# Patient Record
Sex: Male | Born: 1970 | Race: White | Hispanic: No | Marital: Married | Smoking: Never smoker
Health system: Western US, Academic
[De-identification: ages and names within clinical notes are randomized; demographics above are authoritative.]

## PROBLEM LIST (undated history)

## (undated) DIAGNOSIS — F1910 Other psychoactive substance abuse, uncomplicated: Secondary | ICD-10-CM

## (undated) DIAGNOSIS — B20 Human immunodeficiency virus [HIV] disease: Secondary | ICD-10-CM

## (undated) DIAGNOSIS — J45909 Unspecified asthma, uncomplicated: Secondary | ICD-10-CM

## (undated) DIAGNOSIS — Z21 Asymptomatic human immunodeficiency virus [HIV] infection status: Secondary | ICD-10-CM

## (undated) DIAGNOSIS — F1010 Alcohol abuse, uncomplicated: Secondary | ICD-10-CM

## (undated) HISTORY — PX: MANDIBLE SURGERY: SHX707

---

## 2010-11-13 ENCOUNTER — Ambulatory Visit (HOSPITAL_COMMUNITY)
Admission: RE | Admit: 2010-11-13 | Discharge: 2010-11-13 | Payer: Self-pay | Source: Home / Self Care | Admitting: Oral Surgery

## 2011-03-13 LAB — BASIC METABOLIC PANEL
BUN: 13 mg/dL (ref 6–23)
CO2: 28 mEq/L (ref 19–32)
Chloride: 105 mEq/L (ref 96–112)
Potassium: 4.1 mEq/L (ref 3.5–5.1)

## 2011-03-13 LAB — CBC
HCT: 38.5 % — ABNORMAL LOW (ref 39.0–52.0)
HCT: 39.6 % (ref 39.0–52.0)
Hemoglobin: 12.8 g/dL — ABNORMAL LOW (ref 13.0–17.0)
MCH: 28.1 pg (ref 26.0–34.0)
MCHC: 33.2 g/dL (ref 30.0–36.0)
MCV: 84.1 fL (ref 78.0–100.0)
RBC: 4.71 MIL/uL (ref 4.22–5.81)
RDW: 13.6 % (ref 11.5–15.5)
WBC: 4.1 10*3/uL (ref 4.0–10.5)

## 2011-03-13 LAB — COMPREHENSIVE METABOLIC PANEL
CO2: 27 mEq/L (ref 19–32)
Calcium: 9.1 mg/dL (ref 8.4–10.5)
Creatinine, Ser: 1.57 mg/dL — ABNORMAL HIGH (ref 0.4–1.5)
GFR calc non Af Amer: 49 mL/min — ABNORMAL LOW (ref >60)
Glucose, Bld: 84 mg/dL (ref 70–99)

## 2013-09-07 ENCOUNTER — Telehealth: Payer: Self-pay

## 2013-09-07 NOTE — Telephone Encounter (Signed)
Patient called regarding medical records received form [facility].  He states he has an appointment scheduled with [facility] on September 09, 2013 with physician.  I have advised him to keep this appointment and transfer to our facility for next appointment.    Patient stated he did not know he could transfer which is odd since I have his signed release.   Laurell Josephs, RN

## 2014-02-25 ENCOUNTER — Encounter (HOSPITAL_BASED_OUTPATIENT_CLINIC_OR_DEPARTMENT_OTHER): Payer: Self-pay | Admitting: Emergency Medicine

## 2014-02-25 ENCOUNTER — Emergency Department (HOSPITAL_BASED_OUTPATIENT_CLINIC_OR_DEPARTMENT_OTHER)
Admission: EM | Admit: 2014-02-25 | Discharge: 2014-02-26 | Disposition: A | Discharge status: Home / Self Care | Payer: Medicare Other | Attending: Emergency Medicine | Admitting: Emergency Medicine

## 2014-02-25 DIAGNOSIS — L309 Dermatitis, unspecified: Secondary | ICD-10-CM

## 2014-02-25 DIAGNOSIS — R519 Headache, unspecified: Secondary | ICD-10-CM

## 2014-02-25 DIAGNOSIS — F17200 Nicotine dependence, unspecified, uncomplicated: Secondary | ICD-10-CM | POA: Insufficient documentation

## 2014-02-25 DIAGNOSIS — L259 Unspecified contact dermatitis, unspecified cause: Secondary | ICD-10-CM | POA: Insufficient documentation

## 2014-02-25 DIAGNOSIS — R51 Headache: Secondary | ICD-10-CM | POA: Insufficient documentation

## 2014-02-25 MED ORDER — KETOROLAC TROMETHAMINE 60 MG/2ML IM SOLN
30.0000 mg | Freq: Once | INTRAMUSCULAR | Status: AC
  Administered 2014-02-26: 30 mg via INTRAMUSCULAR
  Filled 2014-02-25: qty 2

## 2014-02-25 NOTE — ED Provider Notes (Signed)
CSN: [id number]     Arrival date & time 02/25/14  2329 History   First MD Initiated Contact with Patient 02/25/14 2339     Chief Complaint  Patient presents with  . Headache     (Consider location/radiation/quality/duration/timing/severity/associated sxs/prior Treatment) HPI  This is a 43 yo old male with no significant past medical history who presents with headache and rash x2 months. Patient reports headaches on and off for the last 2 months. He has no history of migraines. He reports an occipital headache. Usually headache improved with Tylenol. He has not taken Tylenol today. Rates his pain today at 9/10. He denies any fevers, neck stiffness. He denies any weakness, numbness, tingling, or vision changes. He states that he came in today because "my daughter came up here to get checked out so I thought I would as well."  Patient also reports a rash over his lower extremities. He states that it is itchy.  He states that he is taking Benadryl with some relief. He notes a history of dry skin. He thinks it may be eczema. He denies any other symptoms including chest pain, shortness breath, abdominal pain, urinary symptoms.  History reviewed. No pertinent past medical history. Past Surgical History  Procedure Laterality Date  . Mandible surgery     History reviewed. No pertinent family history. History  Substance Use Topics  . Smoking status: Current Every Day Smoker  . Smokeless tobacco: Not on file  . Alcohol Use: Yes    Review of Systems  Constitutional: Negative.  Negative for fever.  Eyes: Negative for photophobia and visual disturbance.  Respiratory: Negative.  Negative for chest tightness and shortness of breath.   Cardiovascular: Negative.  Negative for chest pain.  Gastrointestinal: Negative.  Negative for abdominal pain.  Genitourinary: Negative.  Negative for dysuria.  Musculoskeletal: Negative for back pain.  Skin: Positive for rash.  Neurological: Positive for headaches.  Negative for dizziness, weakness and numbness.  All other systems reviewed and are negative.      Allergies  Review of patient's allergies indicates not on file.  Home Medications  No current outpatient prescriptions on file. BP 110/80  Pulse 77  Temp(Src) 98.6 F (37 C) (Oral)  Resp 16  Ht 6' (1.829 m)  Wt 160 lb (72.576 kg)  BMI 21.70 kg/m2  SpO2 96% Physical Exam  Nursing note and vitals reviewed. Constitutional: He is oriented to person, place, and time. No distress.  thin  HENT:  Head: Normocephalic and atraumatic.  Eyes: Conjunctivae and EOM are normal. Pupils are equal, round, and reactive to light.  Neck: Neck supple.  Cardiovascular: Normal rate, regular rhythm and normal heart sounds.   No murmur heard. Pulmonary/Chest: Effort normal and breath sounds normal. No respiratory distress. He has no wheezes.  Abdominal: Soft. Bowel sounds are normal. There is no tenderness. There is no rebound.  Musculoskeletal: He exhibits no edema.  Lymphadenopathy:    He has no cervical adenopathy.  Neurological: He is alert and oriented to person, place, and time. No cranial nerve deficit.  Coordination intact to finger-nose-finger, 5 out of 5 strength in all 470s, normal gait  Skin: Skin is warm and dry.  3 distinct patches over the bilateral shins approximately 3 cm in diameter, dry-appearing with excoriation and darkening of skin color, no obvious erythema, no vesicular lesions noted  Psychiatric: He has a normal mood and affect.    ED Course  Procedures (including critical care time) Labs Review Labs Reviewed - No data  to display Imaging Review No results found.  EKG Interpretation   None       MDM   Final diagnoses:  Headache  Eczema    Patient presents with headache and rash for 2 months. He is nontoxic-appearing and vital signs are within normal limits. He has a nonfocal neurologic exam. He has not taken anything for his headache today. At this time have  low suspicion for meningitis or subarachnoid given duration of symptoms. Patient does not have a primary care provider. Patient was given Toradol for his headache. Patient noted to have extremely dry skin. Rash consistent with eczema. Patient encouraged to use lotion for dry skin and hydrocortisone on patches.    Patient's headache improved with Toradol. Patient given primary care followup and resources.  After history, exam, and medical workup I feel the patient has been appropriately medically screened and is safe for discharge home. Pertinent diagnoses were discussed with the patient. Patient was given return precautions.     Shon Baton, MD 02/26/14 [phone]

## 2014-02-25 NOTE — ED Notes (Signed)
Headache and rash to lower legs x 2 months, pt here with daughter so he decided to get it checked out.

## 2014-02-26 MED ORDER — IBUPROFEN 400 MG PO TABS: 400.0000 mg | ORAL_TABLET | Freq: Four times a day (QID) | ORAL | Status: DC | PRN

## 2014-02-26 MED ORDER — HYDROCORTISONE 1 % EX CREA: TOPICAL_CREAM | CUTANEOUS | Status: DC

## 2014-02-26 NOTE — Discharge Instructions (Signed)
Eczema Eczema, also called atopic dermatitis, is a skin disorder that causes inflammation of the skin. It causes a red rash and dry, scaly skin. The skin becomes very itchy. Eczema is generally worse during the cooler winter months and often improves with the warmth of summer. Eczema usually starts showing signs in infancy. Some children outgrow eczema, but it may last through adulthood.  CAUSES  The exact cause of eczema is not known, but it appears to run in families. People with eczema often have a family history of eczema, allergies, asthma, or hay fever. Eczema is not contagious. Flare-ups of the condition may be caused by:   Contact with something you are sensitive or allergic to.   Stress. SIGNS AND SYMPTOMS  Dry, scaly skin.   Red, itchy rash.   Itchiness. This may occur before the skin rash and may be very intense.  DIAGNOSIS  The diagnosis of eczema is usually made based on symptoms and medical history. TREATMENT  Eczema cannot be cured, but symptoms usually can be controlled with treatment and other strategies. A treatment plan might include:  Controlling the itching and scratching.   Use over-the-counter antihistamines as directed for itching. This is especially useful at night when the itching tends to be worse.   Use over-the-counter steroid creams as directed for itching.   Avoid scratching. Scratching makes the rash and itching worse. It may also result in a skin infection (impetigo) due to a break in the skin caused by scratching.   Keeping the skin well moisturized with creams every day. This will seal in moisture and help prevent dryness. Lotions that contain alcohol and water should be avoided because they can dry the skin.   Limiting exposure to things that you are sensitive or allergic to (allergens).   Recognizing situations that cause stress.   Developing a plan to manage stress.  HOME CARE INSTRUCTIONS   Only take over-the-counter or  prescription medicines as directed by your health care provider.   Do not use anything on the skin without checking with your health care provider.   Keep baths or showers short (5 minutes) in warm (not hot) water. Use mild cleansers for bathing. These should be unscented. You may add nonperfumed bath oil to the bath water. It is best to avoid soap and bubble bath.   Immediately after a bath or shower, when the skin is still damp, apply a moisturizing ointment to the entire body. This ointment should be a petroleum ointment. This will seal in moisture and help prevent dryness. The thicker the ointment, the better. These should be unscented.   Keep fingernails cut short. Children with eczema may need to wear soft gloves or mittens at night after applying an ointment.   Dress in clothes made of cotton or cotton blends. Dress lightly, because heat increases itching.   A child with eczema should stay away from anyone with fever blisters or cold sores. The virus that causes fever blisters (herpes simplex) can cause a serious skin infection in children with eczema. SEEK MEDICAL CARE IF:   Your itching interferes with sleep.   Your rash gets worse or is not better within 1 week after starting treatment.   You see pus or soft yellow scabs in the rash area.   You have a fever.   You have a rash flare-up after contact with someone who has fever blisters.  Document Released: 12/14/2000 Document Revised: 10/07/2013 Document Reviewed: 07/20/2013 [facility] Patient Information 2014 [place]. Migraine Headache  A migraine headache is an intense, throbbing pain on one or both sides of your head. A migraine can last for 30 minutes to several hours. CAUSES  The exact cause of a migraine headache is not always known. However, a migraine may be caused when nerves in the brain become irritated and release chemicals that cause inflammation. This causes pain. Certain things may also trigger  migraines, such as:  Alcohol.  Smoking.  Stress.  Menstruation.  Aged cheeses.  Foods or drinks that contain nitrates, glutamate, aspartame, or tyramine.  Lack of sleep.  Chocolate.  Caffeine.  Hunger.  Physical exertion.  Fatigue.  Medicines used to treat chest pain (nitroglycerine), birth control pills, estrogen, and some blood pressure medicines. SIGNS AND SYMPTOMS  Pain on one or both sides of your head.  Pulsating or throbbing pain.  Severe pain that prevents daily activities.  Pain that is aggravated by any physical activity.  Nausea, vomiting, or both.  Dizziness.  Pain with exposure to bright lights, loud noises, or activity.  General sensitivity to bright lights, loud noises, or smells. Before you get a migraine, you may get warning signs that a migraine is coming (aura). An aura may include:  Seeing flashing lights.  Seeing bright spots, halos, or zig-zag lines.  Having tunnel vision or blurred vision.  Having feelings of numbness or tingling.  Having trouble talking.  Having muscle weakness. DIAGNOSIS  A migraine headache is often diagnosed based on:  Symptoms.  Physical exam.  A CT scan or MRI of your head. These imaging tests cannot diagnose migraines, but they can help rule out other causes of headaches. TREATMENT Medicines may be given for pain and nausea. Medicines can also be given to help prevent recurrent migraines.  HOME CARE INSTRUCTIONS  Only take over-the-counter or prescription medicines for pain or discomfort as directed by your health care provider. The use of long-term narcotics is not recommended.  Lie down in a dark, quiet room when you have a migraine.  Keep a journal to find out what may trigger your migraine headaches. For example, write down:  What you eat and drink.  How much sleep you get.  Any change to your diet or medicines.  Limit alcohol consumption.  Quit smoking if you smoke.  Get 7 9 hours  of sleep, or as recommended by your health care provider.  Limit stress.  Keep lights dim if bright lights bother you and make your migraines worse. SEEK IMMEDIATE MEDICAL CARE IF:   Your migraine becomes severe.  You have a fever.  You have a stiff neck.  You have vision loss.  You have muscular weakness or loss of muscle control.  You start losing your balance or have trouble walking.  You feel faint or pass out.  You have severe symptoms that are different from your first symptoms. MAKE SURE YOU:   Understand these instructions.  Will watch your condition.  Will get help right away if you are not doing well or get worse. Document Released: 12/17/2005 Document Revised: 10/07/2013 Document Reviewed: 08/24/2013 [facility] Patient Information 2014 [place], [place].

## 2014-12-30 ENCOUNTER — Emergency Department (HOSPITAL_BASED_OUTPATIENT_CLINIC_OR_DEPARTMENT_OTHER)
Admission: EM | Admit: 2014-12-30 | Discharge: 2014-12-30 | Disposition: A | Discharge status: Home / Self Care | Payer: Medicare Other | Attending: Emergency Medicine | Admitting: Emergency Medicine

## 2014-12-30 ENCOUNTER — Emergency Department (HOSPITAL_BASED_OUTPATIENT_CLINIC_OR_DEPARTMENT_OTHER): Payer: Medicare Other

## 2014-12-30 ENCOUNTER — Encounter (HOSPITAL_BASED_OUTPATIENT_CLINIC_OR_DEPARTMENT_OTHER): Payer: Self-pay | Admitting: *Deleted

## 2014-12-30 DIAGNOSIS — Z21 Asymptomatic human immunodeficiency virus [HIV] infection status: Secondary | ICD-10-CM | POA: Insufficient documentation

## 2014-12-30 DIAGNOSIS — Z7952 Long term (current) use of systemic steroids: Secondary | ICD-10-CM | POA: Diagnosis not present

## 2014-12-30 DIAGNOSIS — Z79899 Other long term (current) drug therapy: Secondary | ICD-10-CM | POA: Insufficient documentation

## 2014-12-30 DIAGNOSIS — J40 Bronchitis, not specified as acute or chronic: Secondary | ICD-10-CM | POA: Insufficient documentation

## 2014-12-30 DIAGNOSIS — R059 Cough, unspecified: Secondary | ICD-10-CM

## 2014-12-30 DIAGNOSIS — Z720 Tobacco use: Secondary | ICD-10-CM | POA: Insufficient documentation

## 2014-12-30 DIAGNOSIS — R0602 Shortness of breath: Secondary | ICD-10-CM | POA: Diagnosis present

## 2014-12-30 DIAGNOSIS — R0981 Nasal congestion: Secondary | ICD-10-CM

## 2014-12-30 DIAGNOSIS — R05 Cough: Secondary | ICD-10-CM

## 2014-12-30 HISTORY — DX: Other psychoactive substance abuse, uncomplicated: F19.10

## 2014-12-30 HISTORY — DX: Human immunodeficiency virus (HIV) disease: B20

## 2014-12-30 HISTORY — DX: Unspecified asthma, uncomplicated: J45.909

## 2014-12-30 HISTORY — DX: Asymptomatic human immunodeficiency virus (hiv) infection status: Z21

## 2014-12-30 HISTORY — DX: Alcohol abuse, uncomplicated: F10.10

## 2014-12-30 MED ORDER — OXYMETAZOLINE HCL 0.05 % NA SOLN
1.0000 | Freq: Once | NASAL | Status: AC
  Administered 2014-12-30: 1 via NASAL
  Filled 2014-12-30: qty 15

## 2014-12-30 NOTE — ED Provider Notes (Signed)
CSN: [id number]     Arrival date & time 12/30/14  [id number] History   First MD Initiated Contact with Patient 12/30/14 [phone]     Chief Complaint  Patient presents with  . Shortness of Breath     (Consider location/radiation/quality/duration/timing/severity/associated sxs/prior Treatment) HPI  Pt has had cough and congestion for approx 1 month.  States the cough is now productive of yellow/green mucous.  States he had fever at the beginning of his illness, but none now.  No shortness of breath.  Has had nasal congestion as well.  No chest pain or leg swelling.  He does smoke cigarettes.  [facility]  Has only been using albuterol "every once in a while".  There are no other associated systemic symptoms, there are no other alleviating or modifying factors.   Past Medical History  Diagnosis Date  . Asthma   . HIV (human immunodeficiency virus infection)   . Alcohol abuse   . Substance abuse    Past Surgical History  Procedure Laterality Date  . Mandible surgery     No family history on file. History  Substance Use Topics  . Smoking status: Current Every Day Smoker -- 1.00 packs/day    Types: Cigarettes  . Smokeless tobacco: Never Used  . Alcohol Use: Yes     Comment: clean date 12/08/14    Review of Systems  ROS reviewed and all otherwise negative except for mentioned in HPI    Allergies  Review of patient's allergies indicates no known allergies.  Home Medications   Prior to Admission medications   Medication Sig Start Date End Date Taking? Authorizing Provider  acyclovir (ZOVIRAX) 400 MG tablet Take 400 mg by mouth 2 (two) times daily.   Yes Historical Provider, MD  albuterol (PROVENTIL) (2.5 MG/3ML) 0.083% nebulizer solution Take 2.5 mg by nebulization 2 (two) times daily as needed for wheezing or shortness of breath.   Yes Historical Provider, MD  clotrimazole (LOTRIMIN) 1 % cream Apply 1 application topically 2 (two) times daily.   Yes Historical Provider,  MD  emtricitabine-tenofovir (TRUVADA) 200-300 MG per tablet Take 1 tablet by mouth daily.   Yes Historical Provider, MD  etravirine (INTELENCE) 100 MG tablet Take 100 mg by mouth 2 (two) times daily with a meal.   Yes Historical Provider, MD  gabapentin (NEURONTIN) 300 MG capsule Take 300 mg by mouth 3 (three) times daily.   Yes Historical Provider, MD  nicotine polacrilex (NICORETTE) 4 MG gum Take 4 mg by mouth as needed for smoking cessation.   Yes Historical Provider, MD  raltegravir (ISENTRESS) 400 MG tablet Take 400 mg by mouth 2 (two) times daily.   Yes Historical Provider, MD  topiramate (TOPAMAX) 50 MG tablet Take 50 mg by mouth 2 (two) times daily.   Yes Historical Provider, MD  hydrocortisone cream 1 % Apply to affected area 2 times daily 02/26/14   Shon Batonourtney F Horton, MD  ibuprofen (ADVIL,MOTRIN) 400 MG tablet Take 1 tablet (400 mg total) by mouth every 6 (six) hours as needed. 02/26/14   Shon Batonourtney F Horton, MD   BP 113/74 mmHg  Pulse 86  Temp(Src) 98 F (36.7 C)  Resp 20  Ht 6\' 1"  (1.854 m)  Wt 158 lb (71.668 kg)  BMI 20.85 kg/m2  SpO2 100%  Vitals reviewed Physical Exam  Physical Examination: General appearance - alert, well appearing, and in no distress Mental status - alert, oriented to person, place, and time Eyes - no conjunctival injection,  no scleral icterus Mouth - mucous membranes moist, pharynx normal without lesions Chest - clear to auscultation, no wheezes, rales or rhonchi, symmetric air entry Heart - normal rate, regular rhythm, normal S1, S2, no murmurs, rubs, clicks or gallops Abdomen - soft, nontender, nondistended, no masses or organomegaly Extremities - peripheral pulses normal, no pedal edema, no clubbing or cyanosis Skin - normal coloration and turgor, no rashes  ED Course  Procedures (including critical care time) Labs Review Labs Reviewed - No data to display  Imaging Review Dg Chest 2 View  12/30/2014   CLINICAL DATA:  One month history of cough  and congestion causing shortness of breath.  EXAM: CHEST  2 VIEW  COMPARISON:  11/20/2009.  FINDINGS: The heart size and mediastinal contours are within normal limits. Both lungs are clear. The visualized skeletal structures are unremarkable.  IMPRESSION: No acute cardiopulmonary findings.   Electronically Signed   By: Loralie ChampagneMark  Gallerani M.D.   On: 12/30/2014 09:46     EKG Interpretation None      MDM   Final diagnoses:  Cough  nasal congestion  Pt presenting with cough and nasal congestion, likely viral source.  Pt can increase frequency of [facility]ol [facility] to every 4 hours, afrin nasal spray twice daily x 3 days.  CXR reassuring.   Xray images reviewed and interpreted by me as well.  Discharged with strict return precautions.  Pt agreeable with plan.  Nursing notes including past medical history and social history reviewed and considered in documentation     Ethelda ChickMartha K Linker, MD 12/31/14 1332

## 2014-12-30 NOTE — ED Notes (Signed)
Patient states he has had sob for one month, which has been associated with a cough.  States the cough is now productive with yellow green secretions.  Denies fever.

## 2014-12-30 NOTE — Discharge Instructions (Signed)
Return to the ED with any concerns including difficulty breathing despite using albuterol 2 puffs every 4 hours, not drinking fluids, decreased urine output, vomiting and not able to keep down liquids or medications, decreased level of alertness/lethargy, or any other alarming symptoms  You should also use afrin 2 sprays each nostril twice daily for no more than 3 days

## 2018-02-16 ENCOUNTER — Inpatient Hospital Stay: Admit: 2018-02-16 | Discharge: 2018-02-16 | Disposition: A | Discharge status: Home Health | Payer: Self-pay

## 2018-02-20 ENCOUNTER — Ambulatory Visit
Payer: BLUE CROSS/BLUE SHIELD | Attending: Orthopaedic Surgery of the Spine | Admitting: Orthopaedic Surgery of the Spine

## 2018-02-20 ENCOUNTER — Encounter: Payer: Self-pay | Admitting: Orthopaedic Surgery of the Spine

## 2018-02-20 ENCOUNTER — Inpatient Hospital Stay
Admit: 2018-02-20 | Discharge: 2018-02-20 | Disposition: A | Discharge status: Home Health | Payer: Self-pay | Attending: Orthopaedic Surgery of the Spine | Admitting: Orthopaedic Surgery of the Spine

## 2018-02-20 ENCOUNTER — Other Ambulatory Visit: Payer: Self-pay | Admitting: Orthopaedic Surgery of the Spine

## 2018-02-20 VITALS — BP 127/89 | HR 78 | Temp 96.1°F | Resp 16 | Ht 67.0 in | Wt 183.0 lb

## 2018-02-20 DIAGNOSIS — S2232XA Fracture of one rib, left side, initial encounter for closed fracture: Principal | ICD-10-CM | POA: Insufficient documentation

## 2018-02-20 MED ORDER — ATORVASTATIN CALCIUM 80 MG OR TABS
ORAL_TABLET | ORAL | 3 refills | Status: AC
Start: 2018-01-03 — End: ?

## 2018-02-20 MED ORDER — BRILINTA 90 MG PO TABS
ORAL_TABLET | ORAL | Status: AC
Start: 2018-02-15 — End: ?

## 2018-02-20 MED ORDER — LIDOCAINE 5 % EX PTCH
MEDICATED_PATCH | CUTANEOUS | 0 refills | Status: AC
Start: 2018-02-16 — End: ?

## 2018-02-20 MED ORDER — CARISOPRODOL 350 MG OR TABS
350.00 mg | ORAL_TABLET | Freq: Three times a day (TID) | ORAL | 0 refills | Status: AC
Start: 2018-02-16 — End: ?

## 2018-02-26 NOTE — Progress Notes (Signed)
Elijah Dean is a 47 year old gentleman who has had approximately 5 days of significant pain in his lower thoracic spine. He did simple lot ease last week and had a slight tremor in his low back.  He then had severe spasms in his low back on Saturday.  The severe pain started on Sunday.  He has had no change in his bowel or bladder habits and no radiation into his upper lower extremities.  Pain at its worst is 8-9 out 10 at best is 2/10 pain diagram shows burning stabbing and burning over the right lower ribs and over the a posterior flank and into the iliac crest.  He gets nocturnal pain which sometimes awakens him from sleep.  The pain is worse with sitting bending coughing and sneezing and better with recumbency acupuncture and medications.    Past medical history:  Heart disease.    Past surgical history:  Cardiac stent 09/23/2017.    Family Medical history:  He has a family history of diabetes and cancer.  His parents are both living in their 62s.  He has 2 siblings.    Social history:  He is married with 3 children.  He does not use alcohol tobacco or recreational substances.  He works 50 hours per week.    Review of systems:  Negative on 13 system questionnaire.    Imaging:  CT scan of the abdomen dated February 16, 2018 shows an L4 hemangioma, some wedging without an acute fracture of T10 and mild degenerative changes in the lower thoracic spine. On the right rib head of T10 there is at heterotopic calcium formation that is on the rib head and also on the vertebral body.  It appears to be benign.    A CT scan of the lumbar spine dated 02/16/2018 also labeled [facility] does not have any additional information on it.    A lumbar x-ray with the same labile dated 02/16/2018 shows a mild rotation of the lower lumbar spine as compared to the upper lumbar spine and is otherwise unremarkable.    Physical examination:  There is a well-localized tenderness in the bottom of the rib cage on the  right side just lateral to the kidney.  There is no tenderness directly over the kidney or medial to the kidney.  This is approximately the T10 level.  Reflexes are 0 in the upper and lower extremities.  Neurologic examination in both the upper and lower extremities shows 5/5 strength and no areas of numbness.  There is no sciatic notch tenderness and no lumbosacral junction tenderness.    This is most probably a fractured rib.  Elijah Dean has no idea how he would fracture his rib, but the clinical impression is that of a left posterior lateral fractured rib.  His current pain medication regime is working well for him and so that was not modified.  Topical applications such as cold packs or various over-the-counter topical pain medications, lidocaine patches, etc. were discussed and none were prescribed at this time. This is probably going to heal without further intervention. Nonetheless it was made clear that if his pain does not resolve in 2-3 weeks he should return for re-evaluation and so that more aggressive testing and imaging can be put in place. He was informed that the only real concern is that this might be a tumor.  If the pain resolves in 2- 3 weeks then is most likely not.  It was recommended that he follow up in 2-3 weeks.  The use of a rib belt or a high corset was also described.  His CT scan was reviewed and there was no obvious rib fracture on that CT scan.  There was a small rib abnormality which was probably artifact. We will not need new x-rays at the time of his follow-up

## 2018-03-06 ENCOUNTER — Encounter: Payer: Self-pay | Admitting: Orthopaedic Surgery of the Spine

## 2018-03-06 ENCOUNTER — Ambulatory Visit
Payer: BLUE CROSS/BLUE SHIELD | Attending: Orthopaedic Surgery of the Spine | Admitting: Orthopaedic Surgery of the Spine

## 2018-03-06 VITALS — BP 139/89 | HR 83 | Temp 97.7°F | Resp 15 | Ht 67.0 in | Wt 183.0 lb

## 2018-03-06 DIAGNOSIS — M546 Pain in thoracic spine: Secondary | ICD-10-CM | POA: Insufficient documentation

## 2018-03-06 DIAGNOSIS — M5414 Radiculopathy, thoracic region: Principal | ICD-10-CM | POA: Insufficient documentation

## 2018-03-06 MED ORDER — HYDROCODONE-ACETAMINOPHEN 5-325 MG OR TABS
1.0000 | ORAL_TABLET | Freq: Four times a day (QID) | ORAL | 0 refills | Status: DC | PRN
Start: 2018-03-06 — End: 2018-03-26

## 2018-03-06 MED ORDER — PREDNISONE 5 MG OR TABS
5.0000 mg | ORAL_TABLET | Freq: Four times a day (QID) | ORAL | 2 refills | Status: DC
Start: 2018-03-06 — End: 2018-03-26

## 2018-03-07 NOTE — Progress Notes (Signed)
Elijah ReeseRobert Brandenberger is a 47 year old gentleman who started with some back pain in the region of his right kidney and had an extensive workup which was essentially negative. When he was last seen it was felt that he might have a rib fracture. Since that time he has developed symptoms of numbness and pain along the inferior aspect of the inferior rib with numbness going around to the midline anteriorly near near his umbilicus.    On physical examination he does have some numbness along the chest wall that appears to be along the T12 dermatome to the right.  It is exacerbated with percussion in the area of T12.  He does not have any hyper reflexia in the lower extremities and does not have any signs of myelopathy.  He is not had any problems with bowel or bladder control.    [facility] was reviewed and does appear to show a small disc herniation at the T12-L1 right area.    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  He will be sent for an MRI scan of thoracic spine without contrast and follow-up after the scan is completed.  In the meantime he was given 5 mg prednisone and 5 mg hydrocodone to try and help him deal with his discomfort.  He was cautioned on how to use the prednisone, and the risk of taking prednisone were discussed. He was also advised not to take the hydrocodone if he is not having pain.

## 2018-03-11 ENCOUNTER — Inpatient Hospital Stay: Admit: 2018-03-11 | Discharge: 2018-03-11 | Disposition: A | Discharge status: Home Health | Payer: Self-pay

## 2018-03-12 ENCOUNTER — Ambulatory Visit: Payer: BLUE CROSS/BLUE SHIELD | Attending: Orthopaedic Surgery | Admitting: Orthopaedic Surgery

## 2018-03-12 ENCOUNTER — Other Ambulatory Visit: Payer: Self-pay | Admitting: Orthopaedic Surgery

## 2018-03-12 VITALS — BP 127/86 | HR 83 | Temp 98.6°F | Resp 17

## 2018-03-12 DIAGNOSIS — M4724 Other spondylosis with radiculopathy, thoracic region: Principal | ICD-10-CM | POA: Insufficient documentation

## 2018-03-12 NOTE — Progress Notes (Signed)
Elijah ReeseRobert Dean is a 47 year old gentleman who started with some back pain in the region of his right kidney and had an extensive workup which was essentially negative. He had radicular pain along this chest radiating to his umbilicus.  He took oral steroids and the radicular pain is improved.      On physical examination he does have some numbness along the chest wall that appears to be along the T12 dermatome to the right.  It is exacerbated with percussion in the area of T12.  He does not have any hyper reflexia in the lower extremities and does not have any signs of myelopathy.  He is not had any problems with bowel or bladder control.    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  He will continue to rest and follow up in 2 weeks to start PT.

## 2018-03-20 ENCOUNTER — Ambulatory Visit: Payer: BLUE CROSS/BLUE SHIELD | Admitting: Orthopaedic Surgery of the Spine

## 2018-03-26 ENCOUNTER — Ambulatory Visit: Payer: BLUE CROSS/BLUE SHIELD | Attending: Orthopaedic Surgery | Admitting: Orthopaedic Surgery

## 2018-03-26 DIAGNOSIS — M5440 Lumbago with sciatica, unspecified side: Secondary | ICD-10-CM | POA: Insufficient documentation

## 2018-03-26 DIAGNOSIS — M5124 Other intervertebral disc displacement, thoracic region: Principal | ICD-10-CM | POA: Insufficient documentation

## 2018-03-26 MED ORDER — HYDROCODONE-ACETAMINOPHEN 5-325 MG OR TABS
1.00 | ORAL_TABLET | Freq: Four times a day (QID) | ORAL | 0 refills | Status: AC | PRN
Start: 2018-03-26 — End: ?

## 2018-03-26 MED ORDER — PREDNISONE 5 MG OR TABS
5.00 mg | ORAL_TABLET | Freq: Four times a day (QID) | ORAL | 2 refills | Status: AC
Start: 2018-03-26 — End: ?

## 2018-03-26 NOTE — Progress Notes (Signed)
Elijah ReeseRobert Dean is a 47 year old gentleman who started with some back pain in the region of his right kidney and had an extensive workup which was essentially negative. He had radicular pain along this chest radiating to his umbilicus.  He took oral steroids and the radicular pain is improved.      On physical examination he does have some numbness along the chest wall that appears to be along the T12 dermatome to the right.  It is exacerbated with percussion in the area of T12.  He does not have any hyper reflexia in the lower extremities and does not have any signs of myelopathy.  He is not had any problems with bowel or bladder control.    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  He will start PT and follow up in 6 weeks.

## 2018-05-07 ENCOUNTER — Ambulatory Visit: Payer: BLUE CROSS/BLUE SHIELD | Admitting: Orthopaedic Surgery

## 2018-08-04 ENCOUNTER — Emergency Department (HOSPITAL_COMMUNITY)
Admission: EM | Admit: 2018-08-04 | Discharge: 2018-08-04 | Disposition: A | Discharge status: Home / Self Care | Payer: Medicare Other | Attending: Emergency Medicine | Admitting: Emergency Medicine

## 2018-08-04 ENCOUNTER — Other Ambulatory Visit: Payer: Self-pay

## 2018-08-04 ENCOUNTER — Encounter (HOSPITAL_COMMUNITY): Payer: Self-pay

## 2018-08-04 ENCOUNTER — Emergency Department (HOSPITAL_COMMUNITY): Payer: Medicare Other

## 2018-08-04 DIAGNOSIS — R42 Dizziness and giddiness: Secondary | ICD-10-CM | POA: Insufficient documentation

## 2018-08-04 DIAGNOSIS — F17210 Nicotine dependence, cigarettes, uncomplicated: Secondary | ICD-10-CM | POA: Insufficient documentation

## 2018-08-04 DIAGNOSIS — Z79899 Other long term (current) drug therapy: Secondary | ICD-10-CM | POA: Insufficient documentation

## 2018-08-04 DIAGNOSIS — J45909 Unspecified asthma, uncomplicated: Secondary | ICD-10-CM | POA: Diagnosis not present

## 2018-08-04 DIAGNOSIS — Z21 Asymptomatic human immunodeficiency virus [HIV] infection status: Secondary | ICD-10-CM | POA: Diagnosis not present

## 2018-08-04 DIAGNOSIS — R51 Headache: Secondary | ICD-10-CM | POA: Insufficient documentation

## 2018-08-04 LAB — URINALYSIS, ROUTINE W REFLEX MICROSCOPIC
BILIRUBIN URINE: NEGATIVE
GLUCOSE, UA: NEGATIVE mg/dL
HGB URINE DIPSTICK: NEGATIVE
KETONES UR: NEGATIVE mg/dL
NITRITE: NEGATIVE
PROTEIN: NEGATIVE mg/dL
Specific Gravity, Urine: 1.028 (ref 1.005–1.030)
pH: 6 (ref 5.0–8.0)

## 2018-08-04 LAB — CBC
HEMATOCRIT: 36.6 % — AB (ref 39.0–52.0)
Hemoglobin: 12.5 g/dL — ABNORMAL LOW (ref 13.0–17.0)
MCH: 29.1 pg (ref 26.0–34.0)
MCHC: 34.2 g/dL (ref 30.0–36.0)
MCV: 85.1 fL (ref 78.0–100.0)
PLATELETS: 202 10*3/uL (ref 150–400)
RBC: 4.3 MIL/uL (ref 4.22–5.81)
RDW: 13.8 % (ref 11.5–15.5)
WBC: 2.3 10*3/uL — AB (ref 4.0–10.5)

## 2018-08-04 LAB — BASIC METABOLIC PANEL
Anion gap: 7 (ref 5–15)
BUN: 11 mg/dL (ref 6–20)
CALCIUM: 9.1 mg/dL (ref 8.9–10.3)
CO2: 28 mmol/L (ref 22–32)
CREATININE: 1.32 mg/dL — AB (ref 0.61–1.24)
Chloride: 105 mmol/L (ref 98–111)
GFR calc Af Amer: >60 mL/min (ref >60)
GLUCOSE: 83 mg/dL (ref 70–99)
Potassium: 4.4 mmol/L (ref 3.5–5.1)
SODIUM: 140 mmol/L (ref 135–145)

## 2018-08-04 LAB — CBG MONITORING, ED: Glucose-Capillary: 79 mg/dL (ref 70–99)

## 2018-08-04 MED ORDER — IOHEXOL 300 MG/ML  SOLN
75.0000 mL | Freq: Once | INTRAMUSCULAR | Status: AC | PRN
  Administered 2018-08-04: 75 mL via INTRAVENOUS

## 2018-08-04 NOTE — ED Triage Notes (Signed)
Pt states that he has been dizzy since Saturday. No falls.  Pt states that he felt like he was going to faint riding in the ambulance.

## 2018-08-04 NOTE — ED Notes (Signed)
Patient ambulated with little/stand by assist to restroom to get specimen. Ambulated fine from restroom and to room.

## 2018-08-04 NOTE — ED Provider Notes (Signed)
[facility]-EMERGENCY DEPT Provider Note   CSN: [id number] Arrival date & time: 08/04/18  1024     History   Chief Complaint Chief Complaint  Patient presents with  . Dizziness    HPI James Mcintosh is a 47 y.o. male.  HPI   He presents by EMS for evaluation of sensation of dizziness.  The dizziness occurs both when walking and supine.  He has headache, diffuse.  Onset dizziness and headache, 2 days ago.  He denies fever, chills, cough, shortness of breath, focal weakness or paresthesia.  He does not have any back pain or abdominal pain.  He is taking his usual medications.  He smokes cigarettes.  There are no other known modifying factors.  Past Medical History:  Diagnosis Date  . Alcohol abuse   . Asthma   . HIV (human immunodeficiency virus infection) (HCC)   . Substance abuse (HCC)     There are no active problems to display for this patient.   Past Surgical History:  Procedure Laterality Date  . MANDIBLE SURGERY          Home Medications    Prior to Admission medications   Medication Sig Start Date End Date Taking? Authorizing Provider  acetaminophen (TYLENOL) 500 MG tablet Take 1,000 mg by mouth daily as needed for headache.   Yes [provider]  PREZCOBIX 800-150 MG tablet Take 1 tablet by mouth daily. 05/08/18  Yes [provider]  TRIUMEQ 600-50-300 MG tablet Take 1 tablet by mouth daily. 06/24/18  Yes [provider]  hydrocortisone cream 1 % Apply to affected area 2 times daily Patient not taking: Reported on 08/04/2018 02/26/14   Horton, Mayer Maskerourtney F, MD  ibuprofen (ADVIL,MOTRIN) 400 MG tablet Take 1 tablet (400 mg total) by mouth every 6 (six) hours as needed. Patient not taking: Reported on 08/04/2018 02/26/14   Horton, Mayer Maskerourtney F, MD    Family History No family history on file.  Social History Social History   Tobacco Use  . Smoking status: Current Every Day Smoker    Packs/day: 1.00    Types: Cigarettes    . Smokeless tobacco: Never Used  Substance Use Topics  . Alcohol use: Yes    Comment: clean date 12/08/14  . Drug use: Yes    Types: Marijuana    Comment: clean 12/08/14     Allergies   Iohexol   Review of Systems Review of Systems  All other systems reviewed and are negative.    Physical Exam Updated Vital Signs BP 124/90   Pulse 97   Temp 98.2 F (36.8 C) (Oral)   Resp 17   Ht 6\' 1"  (1.854 m)   Wt 68 kg (150 lb)   SpO2 100%   BMI 19.79 kg/m   Physical Exam  Constitutional: He is oriented to person, place, and time. He appears well-developed and well-nourished. No distress.  HENT:  Head: Normocephalic and atraumatic.  Right Ear: External ear normal.  Left Ear: External ear normal.  Eyes: Pupils are equal, round, and reactive to light. Conjunctivae and EOM are normal.  Neck: Normal range of motion and phonation normal. Neck supple.  No meningismus.  Cardiovascular: Normal rate, regular rhythm and normal heart sounds.  Pulmonary/Chest: Effort normal and breath sounds normal. He exhibits no bony tenderness.  Abdominal: Soft. There is no tenderness.  Musculoskeletal: Normal range of motion.  Neurological: He is alert and oriented to person, place, and time. No cranial nerve deficit or sensory deficit.  He exhibits normal muscle tone. Coordination normal.  No dysarthria, aphasia or nystagmus.  No pronator drift.  No ataxia.  Skin: Skin is warm, dry and intact.  Psychiatric: He has a normal mood and affect. His behavior is normal. Judgment and thought content normal.  Nursing note and vitals reviewed.    ED Treatments / Results  Labs (all labs ordered are listed, but only abnormal results are displayed) Labs Reviewed  BASIC METABOLIC PANEL - Abnormal; Notable for the following components:      Result Value   Creatinine, Ser 1.32 (*)    All other components within normal limits  CBC - Abnormal; Notable for the following components:   WBC 2.3 (*)     Hemoglobin 12.5 (*)    HCT 36.6 (*)    All other components within normal limits  URINALYSIS, ROUTINE W REFLEX MICROSCOPIC - Abnormal; Notable for the following components:   Leukocytes, UA MODERATE (*)    Bacteria, UA FEW (*)    All other components within normal limits  CBG MONITORING, ED    EKG EKG Interpretation  Date/Time:  Monday August 04 2018 11:19:30 EDT Ventricular Rate:  53 PR Interval:    QRS Duration: 87 QT Interval:  472 QTC Calculation: 444 R Axis:   76 Text Interpretation:  Sinus rhythm Short PR interval Anterior infarct, old No old tracing to compare Confirmed by Mancel Bale [phone]) on 08/04/2018 3:11:15 PM   Radiology Ct Head W Or Wo Contrast  Result Date: 08/04/2018 CLINICAL DATA:  Vertigo for 2 days.  History of HIV EXAM: CT HEAD WITHOUT AND WITH CONTRAST TECHNIQUE: Contiguous axial images were obtained from the base of the skull through the vertex without and with intravenous contrast CONTRAST:  75mL OMNIPAQUE IOHEXOL 300 MG/ML  SOLN COMPARISON:  05/22/2015 head CT FINDINGS: Brain: No evidence of acute infarction, hemorrhage, hydrocephalus, extra-axial collection or mass lesion/mass effect. Vascular: No hyperdense vessel or unexpected calcification. Visible vessels are patent. Skull: Normal. Negative for fracture or focal lesion. Sinuses/Orbits: Negative.  Clear mastoid and middle ear spaces. IMPRESSION: Negative exam.  No explanation for vertigo. Electronically Signed   By: Marnee Spring M.D.   On: 08/04/2018 14:15    Procedures Procedures (including critical care time)  Medications Ordered in ED Medications  iohexol (OMNIPAQUE) 300 MG/ML solution 75 mL (75 mLs Intravenous Contrast Given 08/04/18 1341)     Initial Impression / Assessment and Plan / ED Course  I have reviewed the triage vital signs and the nursing notes.  Pertinent labs & imaging results that were available during my care of the patient were reviewed by me and considered in my medical  decision making (see chart for details).  Clinical Course as of Aug 04 1517  Mon Aug 04, 2018  1510 Normal except elevated leukocytes and white cells with few bacteria.  Urinalysis, Routine w reflex microscopic(!) [EW]  1510 Normal  CBG monitoring, ED [EW]  1510 Normal except elevated creatinine  Basic metabolic panel(!) [EW]  1510 Normal except white count low, hemoglobin low   [EW]  1510 No acute changes, images reviewed by me  CT Head W or Wo Contrast [EW]    Clinical Course User Index [EW] Mancel Bale, MD     Patient Vitals for the past 24 hrs:  BP Temp Temp src Pulse Resp SpO2 Height Weight  08/04/18 1400 124/90 - - 97 17 100 % - -  08/04/18 1330 116/75 - - (!) 48 17 99 % - -  08/04/18 1300 122/84 - - (!) 50 15 99 % - -  08/04/18 1230 116/87 - - (!) 59 (!) 21 100 % - -  08/04/18 1200 114/81 - - (!) 50 20 99 % - -  08/04/18 1130 113/76 - - (!) 49 16 99 % - -  08/04/18 1039 - - - - - - 6\' 1"  (1.854 m) 68 kg (150 lb)  08/04/18 1034 111/76 98.2 F (36.8 C) Oral 65 12 98 % - -    3:11 PM Reevaluation with update and discussion. After initial assessment and treatment, an updated evaluation reveals patient is comfortable, denies dizziness or headache at this time.  Findings discussed with patient and all questions were answered. Mancel Bale   Medical Decision Making: Nonspecific dizziness, without signs for acute intracranial abnormality including infectious lesions, and patient with HIV disease.  Doubt meningitis, metabolic instability or impending vascular collapse.  CRITICAL CARE-no Performed by: Mancel Bale  Nursing Notes Reviewed/ Care Coordinated Applicable Imaging Reviewed Interpretation of Laboratory Data incorporated into ED treatment  The patient appears reasonably screened and/or stabilized for discharge and I doubt any other medical condition or other [facility] requiring further screening, evaluation, or treatment in the ED at this time prior to  discharge.  Plan: Home Medications-continue usual medications and use OTC analgesia; Home Treatments-rest, fluids, gradually advance activity; return here if the recommended treatment, does not improve the symptoms; Recommended follow up-PCP follow-up as needed.   Final Clinical Impressions(s) / ED Diagnoses   Final diagnoses:  Dizziness    ED Discharge Orders    None       Mancel Bale, MD 08/04/18 [phone]

## 2018-08-04 NOTE — Discharge Instructions (Addendum)
The testing today was reassuring.  There is no sign of any complications or brain problems.  Continue taking her usual medications.  Be careful when you stand up and sit down if you feel dizzy.  Make sure you are drinking plenty of fluids, and eating 3 meals each day.  Follow-up with your doctor as needed for problems.

## 2018-11-23 ENCOUNTER — Encounter (HOSPITAL_COMMUNITY): Payer: Self-pay

## 2018-11-23 ENCOUNTER — Other Ambulatory Visit: Payer: Self-pay

## 2018-11-23 ENCOUNTER — Emergency Department (HOSPITAL_COMMUNITY)
Admission: EM | Admit: 2018-11-23 | Discharge: 2018-11-24 | Disposition: A | Discharge status: Home / Self Care | Payer: Medicare Other | Attending: Emergency Medicine | Admitting: Emergency Medicine

## 2018-11-23 DIAGNOSIS — B20 Human immunodeficiency virus [HIV] disease: Secondary | ICD-10-CM | POA: Diagnosis not present

## 2018-11-23 DIAGNOSIS — J101 Influenza due to other identified influenza virus with other respiratory manifestations: Secondary | ICD-10-CM | POA: Diagnosis not present

## 2018-11-23 DIAGNOSIS — J45909 Unspecified asthma, uncomplicated: Secondary | ICD-10-CM | POA: Insufficient documentation

## 2018-11-23 DIAGNOSIS — R509 Fever, unspecified: Secondary | ICD-10-CM | POA: Diagnosis present

## 2018-11-23 DIAGNOSIS — Z79899 Other long term (current) drug therapy: Secondary | ICD-10-CM | POA: Diagnosis not present

## 2018-11-23 DIAGNOSIS — F17210 Nicotine dependence, cigarettes, uncomplicated: Secondary | ICD-10-CM | POA: Insufficient documentation

## 2018-11-23 LAB — COMPREHENSIVE METABOLIC PANEL
ALK PHOS: 60 U/L (ref 38–126)
ALT: 29 U/L (ref 0–44)
AST: 41 U/L (ref 15–41)
Albumin: 3.7 g/dL (ref 3.5–5.0)
Anion gap: 9 (ref 5–15)
BUN: 13 mg/dL (ref 6–20)
CALCIUM: 8.6 mg/dL — AB (ref 8.9–10.3)
CO2: 23 mmol/L (ref 22–32)
CREATININE: 1.48 mg/dL — AB (ref 0.61–1.24)
Chloride: 100 mmol/L (ref 98–111)
GFR, EST NON AFRICAN AMERICAN: 54 mL/min — AB (ref >60)
Glucose, Bld: 92 mg/dL (ref 70–99)
Potassium: 4 mmol/L (ref 3.5–5.1)
SODIUM: 132 mmol/L — AB (ref 135–145)
Total Bilirubin: 0.4 mg/dL (ref 0.3–1.2)
Total Protein: 7.4 g/dL (ref 6.5–8.1)

## 2018-11-23 LAB — CBC
HCT: 36.4 % — ABNORMAL LOW (ref 39.0–52.0)
Hemoglobin: 12.3 g/dL — ABNORMAL LOW (ref 13.0–17.0)
MCH: 30.1 pg (ref 26.0–34.0)
MCHC: 33.8 g/dL (ref 30.0–36.0)
MCV: 89 fL (ref 80.0–100.0)
NRBC: 0 % (ref 0.0–0.2)
PLATELETS: 146 10*3/uL — AB (ref 150–400)
RBC: 4.09 MIL/uL — ABNORMAL LOW (ref 4.22–5.81)
RDW: 13.6 % (ref 11.5–15.5)
WBC: 3.3 10*3/uL — ABNORMAL LOW (ref 4.0–10.5)

## 2018-11-23 LAB — LIPASE, BLOOD: Lipase: 35 U/L (ref 11–51)

## 2018-11-23 MED ORDER — SODIUM CHLORIDE 0.9 % IV BOLUS
2000.0000 mL | Freq: Once | INTRAVENOUS | Status: AC
  Administered 2018-11-23: 2000 mL via INTRAVENOUS

## 2018-11-23 MED ORDER — ACETAMINOPHEN 325 MG PO TABS
650.0000 mg | ORAL_TABLET | Freq: Once | ORAL | Status: AC | PRN
  Administered 2018-11-23: 650 mg via ORAL
  Filled 2018-11-23: qty 2

## 2018-11-23 NOTE — ED Triage Notes (Signed)
Pt reports 5 hours ago and had diarrhea. Pt fell asleep and woke up with chills, headache, and pain on left side.

## 2018-11-24 ENCOUNTER — Emergency Department (HOSPITAL_COMMUNITY): Payer: Medicare Other

## 2018-11-24 DIAGNOSIS — J101 Influenza due to other identified influenza virus with other respiratory manifestations: Secondary | ICD-10-CM | POA: Diagnosis not present

## 2018-11-24 LAB — DIFFERENTIAL
BASOS PCT: 0 %
Basophils Absolute: 0 10*3/uL (ref 0.0–0.1)
Eosinophils Absolute: 0 10*3/uL (ref 0.0–0.5)
Eosinophils Relative: 0 %
Lymphocytes Relative: 36 %
Lymphs Abs: 1.1 10*3/uL (ref 0.7–4.0)
MONO ABS: 0.3 10*3/uL (ref 0.1–1.0)
Monocytes Relative: 10 %
NEUTROS ABS: 1.7 10*3/uL (ref 1.7–7.7)
Neutrophils Relative %: 53 %

## 2018-11-24 LAB — INFLUENZA PANEL BY PCR (TYPE A & B)
INFLAPCR: NEGATIVE
INFLBPCR: POSITIVE — AB

## 2018-11-24 LAB — I-STAT CG4 LACTIC ACID, ED: Lactic Acid, Venous: 0.4 mmol/L — ABNORMAL LOW (ref 0.5–1.9)

## 2018-11-24 MED ORDER — ONDANSETRON HCL 4 MG/2ML IJ SOLN
4.0000 mg | Freq: Once | INTRAMUSCULAR | Status: AC
  Administered 2018-11-24: 4 mg via INTRAVENOUS
  Filled 2018-11-24: qty 2

## 2018-11-24 MED ORDER — OSELTAMIVIR PHOSPHATE 75 MG PO CAPS
75.0000 mg | ORAL_CAPSULE | Freq: Once | ORAL | Status: AC
  Administered 2018-11-24: 75 mg via ORAL
  Filled 2018-11-24: qty 1

## 2018-11-24 MED ORDER — OSELTAMIVIR PHOSPHATE 75 MG PO CAPS: 75.0000 mg | ORAL_CAPSULE | Freq: Two times a day (BID) | ORAL | 0 refills | Status: AC

## 2018-11-24 MED ORDER — KETOROLAC TROMETHAMINE 15 MG/ML IJ SOLN
15.0000 mg | Freq: Once | INTRAMUSCULAR | Status: AC
  Administered 2018-11-24: 15 mg via INTRAVENOUS
  Filled 2018-11-24: qty 1

## 2018-11-24 NOTE — ED Notes (Signed)
First set of blood cultures drawn.

## 2018-11-24 NOTE — ED Provider Notes (Signed)
[facility]-EMERGENCY DEPT Provider Note  CSN: [id number] Arrival date & time: 11/23/18 2147  Chief Complaint(s) Diarrhea and Abdominal Pain  HPI James ReeseRobert Mcintosh is a 47 y.o. male with a history of HIV who has a history of noncompliance but reports his more compliant with his meds taken them intermittently missing few days at a time.  He presents with fever and myalgias.  The history is provided by the patient.  Fever   This is a new problem. The current episode started 12 to 24 hours ago. The problem occurs constantly. The problem has not changed since onset.The maximum temperature noted was 102 to 102.9 F. Associated symptoms include diarrhea, congestion, muscle aches and cough. Pertinent negatives include no vomiting. He has tried acetaminophen for the symptoms. The treatment provided mild relief.   Daily EtOH. Last drank yesterday at 9pm.  On review of records, last CD4 in January 2019 was less than 200.  Past Medical History Past Medical History:  Diagnosis Date  . Alcohol abuse   . Asthma   . HIV (human immunodeficiency virus infection) (HCC)   . Substance abuse (HCC)    There are no active problems to display for this patient.  Home Medication(s) Prior to Admission medications   Medication Sig Start Date End Date Taking? Authorizing Provider  acetaminophen (TYLENOL) 500 MG tablet Take 1,000 mg by mouth daily as needed for headache.   Yes [provider]  albuterol (PROVENTIL HFA;VENTOLIN HFA) 108 (90 Base) MCG/ACT inhaler Inhale 1-2 puffs into the lungs every 6 (six) hours as needed for wheezing or shortness of breath.   Yes [provider]  PREZCOBIX 800-150 MG tablet Take 1 tablet by mouth daily. 05/08/18  Yes [provider]  TRIUMEQ 600-50-300 MG tablet Take 1 tablet by mouth daily. 06/24/18  Yes [provider]  oseltamivir (TAMIFLU) 75 MG capsule Take 1 capsule (75 mg total) by mouth every 12 (twelve) hours for 7  days. 11/24/18 12/01/18  Nira Connardama, Prayan Ulin Eduardo, MD                                                                                                                                    Past Surgical History Past Surgical History:  Procedure Laterality Date  . MANDIBLE SURGERY     Family History History reviewed. No pertinent family history.  Social History Social History   Tobacco Use  . Smoking status: Current Every Day Smoker    Packs/day: 1.00    Types: Cigarettes  . Smokeless tobacco: Never Used  Substance Use Topics  . Alcohol use: Yes    Comment: clean date 12/08/14  . Drug use: Yes    Types: Marijuana    Comment: clean 12/08/14   Allergies Iohexol  Review of Systems Review of Systems  Constitutional: Positive for fever.  HENT: Positive for congestion.   Respiratory: Positive for cough.   Gastrointestinal: Positive for diarrhea. Negative  for vomiting.   All other systems are reviewed and are negative for acute change except as noted in the HPI  Physical Exam Vital Signs  I have reviewed the triage vital signs BP 121/80   Pulse 86   Temp (!) 102.3 F (39.1 C) (Oral)   Resp 19   Ht 6\' 1"  (1.854 m)   Wt 63.5 kg   SpO2 95%   BMI 18.47 kg/m   Physical Exam  Constitutional: He is oriented to person, place, and time. He appears well-developed and well-nourished. No distress.  HENT:  Head: Normocephalic and atraumatic.  Nose: Rhinorrhea present.  Eyes: Pupils are equal, round, and reactive to light. Conjunctivae and EOM are normal. Right eye exhibits no discharge. Left eye exhibits no discharge. No scleral icterus.  Neck: Normal range of motion. Neck supple. No muscular tenderness present. Normal range of motion present. No Brudzinski's sign and no Kernig's sign noted.  Cardiovascular: Normal rate and regular rhythm. Exam reveals no gallop and no friction rub.  No murmur heard. Pulmonary/Chest: Effort normal and breath sounds normal. No stridor. No respiratory  distress. He has no rales.  Abdominal: Soft. He exhibits no distension. There is no tenderness.  Musculoskeletal: He exhibits no edema or tenderness.  Neurological: He is alert and oriented to person, place, and time.  Skin: Skin is warm and dry. No rash noted. He is not diaphoretic. No erythema.  Psychiatric: He has a normal mood and affect.  Vitals reviewed.   ED Results and Treatments Labs (all labs ordered are listed, but only abnormal results are displayed) Labs Reviewed  COMPREHENSIVE METABOLIC PANEL - Abnormal; Notable for the following components:      Result Value   Sodium 132 (*)    Creatinine, Ser 1.48 (*)    Calcium 8.6 (*)    GFR calc non Af Amer 54 (*)    All other components within normal limits  CBC - Abnormal; Notable for the following components:   WBC 3.3 (*)    RBC 4.09 (*)    Hemoglobin 12.3 (*)    HCT 36.4 (*)    Platelets 146 (*)    All other components within normal limits  INFLUENZA PANEL BY PCR (TYPE A & B) - Abnormal; Notable for the following components:   Influenza B By PCR POSITIVE (*)    All other components within normal limits  I-STAT CG4 LACTIC ACID, ED - Abnormal; Notable for the following components:   Lactic Acid, Venous 0.40 (*)    All other components within normal limits  LIPASE, BLOOD  DIFFERENTIAL  I-STAT CG4 LACTIC ACID, ED                                                                                                                         EKG  EKG Interpretation  Date/Time:    Ventricular Rate:    PR Interval:    QRS Duration:   QT Interval:    QTC Calculation:  R Axis:     Text Interpretation:        Radiology Dg Chest 2 View  Result Date: 11/24/2018 CLINICAL DATA:  Cough, fever, and chills today. EXAM: CHEST - 2 VIEW COMPARISON:  12/30/2014 FINDINGS: The heart size and mediastinal contours are within normal limits. Both lungs are clear. The visualized skeletal structures are unremarkable. IMPRESSION: Negative.  No  active cardiopulmonary disease. Electronically Signed   By: Myles Rosenthal M.D.   On: 11/24/2018 00:47   Ct Head Wo Contrast  Result Date: 11/24/2018 CLINICAL DATA:  Headache and fever. EXAM: CT HEAD WITHOUT CONTRAST TECHNIQUE: Contiguous axial images were obtained from the base of the skull through the vertex without intravenous contrast. COMPARISON:  Head CT 08/04/2018 FINDINGS: Brain: No intracranial hemorrhage, mass effect, or midline shift. No hydrocephalus. The basilar cisterns are patent. No evidence of territorial infarct or acute ischemia. No extra-axial or intracranial fluid collection. Vascular: No hyperdense vessel. Skull: No fracture or focal lesion. Sinuses/Orbits: Minimal mucosal thickening of the maxillary sinuses, partially included. Mucosal thickening of the sphenoid sinus and throughout the ethmoid air cells. Mastoid air cells are clear. Other: None. IMPRESSION: 1.  No acute intracranial abnormality. 2. Paranasal sinus mucosal thickening is new from prior exam and may represent acute inflammation. Electronically Signed   By: Narda Rutherford M.D.   On: 11/24/2018 02:05   Pertinent labs & imaging results that were available during my care of the patient were reviewed by me and considered in my medical decision making (see chart for details).  Medications Ordered in ED Medications  acetaminophen (TYLENOL) tablet 650 mg (650 mg Oral Given 11/23/18 2159)  sodium chloride 0.9 % bolus 2,000 mL (0 mLs Intravenous Stopped 11/24/18 0233)  ondansetron (ZOFRAN) injection 4 mg (4 mg Intravenous Given 11/24/18 0042)  ketorolac (TORADOL) 15 MG/ML injection 15 mg (15 mg Intravenous Given 11/24/18 0045)  oseltamivir (TAMIFLU) capsule 75 mg (75 mg Oral Given 11/24/18 0248)                                                                                                                                    Procedures Procedures  (including critical care time)  Medical Decision Making / ED Course I  have reviewed the nursing notes for this encounter and the patient's prior records (if available in EHR or on provided paperwork).    47 y.o. male presents with flu-like symptoms for 1day. adequate oral hydration. Rest of history as above.  Patient appears well. No signs of toxicity, patient is interactive. No hypoxia, tachypnea or other signs of respiratory distress. No sign of clinical dehydration. Lung exam clear. Rest of exam as above.  Most consistent with flu-like illness. +influenza B.  Chest x-ray negative.  No evidence suggestive of pharyngitis, AOM, PNA, or meningitis.  Given Tamiflu.  Discussed symptomatic treatment with the patient and they will follow closely with their PCP.    Final Clinical  Impression(s) / ED Diagnoses Final diagnoses:  Influenza B   Disposition: Discharge  Condition: Good  I have discussed the results, Dx and Tx plan with the patient who expressed understanding and agree(s) with the plan. Discharge instructions discussed at great length. The patient was given strict return precautions who verbalized understanding of the instructions. No further questions at time of discharge.    ED Discharge Orders         Ordered    oseltamivir (TAMIFLU) 75 MG capsule  Every 12 hours     11/24/18 0233             This chart was dictated using voice recognition software.  Despite best efforts to proofread,  errors can occur which can change the documentation meaning.   Nira Conn, MD 11/24/18 [phone]

## 2019-08-14 ENCOUNTER — Emergency Department (HOSPITAL_COMMUNITY)
Admission: EM | Admit: 2019-08-14 | Discharge: 2019-08-14 | Disposition: A | Discharge status: Home / Self Care | Payer: Medicare Other | Attending: Emergency Medicine | Admitting: Emergency Medicine

## 2019-08-14 ENCOUNTER — Other Ambulatory Visit: Payer: Self-pay

## 2019-08-14 DIAGNOSIS — Z79899 Other long term (current) drug therapy: Secondary | ICD-10-CM | POA: Diagnosis not present

## 2019-08-14 DIAGNOSIS — F17210 Nicotine dependence, cigarettes, uncomplicated: Secondary | ICD-10-CM | POA: Diagnosis not present

## 2019-08-14 DIAGNOSIS — N390 Urinary tract infection, site not specified: Secondary | ICD-10-CM | POA: Diagnosis not present

## 2019-08-14 DIAGNOSIS — R42 Dizziness and giddiness: Secondary | ICD-10-CM | POA: Diagnosis present

## 2019-08-14 DIAGNOSIS — J45909 Unspecified asthma, uncomplicated: Secondary | ICD-10-CM | POA: Insufficient documentation

## 2019-08-14 DIAGNOSIS — Z21 Asymptomatic human immunodeficiency virus [HIV] infection status: Secondary | ICD-10-CM | POA: Insufficient documentation

## 2019-08-14 LAB — COMPREHENSIVE METABOLIC PANEL
ALT: 41 U/L (ref 0–44)
AST: 44 U/L — ABNORMAL HIGH (ref 15–41)
Albumin: 4.1 g/dL (ref 3.5–5.0)
Alkaline Phosphatase: 93 U/L (ref 38–126)
Anion gap: 10 (ref 5–15)
BUN: 13 mg/dL (ref 6–20)
CO2: 26 mmol/L (ref 22–32)
Calcium: 9 mg/dL (ref 8.9–10.3)
Chloride: 101 mmol/L (ref 98–111)
Creatinine, Ser: 1.24 mg/dL (ref 0.61–1.24)
GFR calc Af Amer: >60 mL/min (ref >60)
GFR calc non Af Amer: >60 mL/min (ref >60)
Glucose, Bld: 91 mg/dL (ref 70–99)
Potassium: 3.9 mmol/L (ref 3.5–5.1)
Sodium: 137 mmol/L (ref 135–145)
Total Bilirubin: 0.9 mg/dL (ref 0.3–1.2)
Total Protein: 8.2 g/dL — ABNORMAL HIGH (ref 6.5–8.1)

## 2019-08-14 LAB — URINALYSIS, ROUTINE W REFLEX MICROSCOPIC
Bilirubin Urine: NEGATIVE
Glucose, UA: NEGATIVE mg/dL
Hgb urine dipstick: NEGATIVE
Ketones, ur: 5 mg/dL — AB
Nitrite: POSITIVE — AB
Protein, ur: NEGATIVE mg/dL
Specific Gravity, Urine: 1.008 (ref 1.005–1.030)
pH: 7 (ref 5.0–8.0)

## 2019-08-14 LAB — CBC
HCT: 40.2 % (ref 39.0–52.0)
Hemoglobin: 13.3 g/dL (ref 13.0–17.0)
MCH: 29 pg (ref 26.0–34.0)
MCHC: 33.1 g/dL (ref 30.0–36.0)
MCV: 87.8 fL (ref 80.0–100.0)
Platelets: 196 10*3/uL (ref 150–400)
RBC: 4.58 MIL/uL (ref 4.22–5.81)
RDW: 14.2 % (ref 11.5–15.5)
WBC: 3.7 10*3/uL — ABNORMAL LOW (ref 4.0–10.5)
nRBC: 0 % (ref 0.0–0.2)

## 2019-08-14 LAB — CBG MONITORING, ED: Glucose-Capillary: 91 mg/dL (ref 70–99)

## 2019-08-14 MED ORDER — CEPHALEXIN 500 MG PO CAPS
1000.0000 mg | ORAL_CAPSULE | Freq: Once | ORAL | Status: AC
  Administered 2019-08-14: 1000 mg via ORAL
  Filled 2019-08-14: qty 2

## 2019-08-14 MED ORDER — SODIUM CHLORIDE 0.9 % IV BOLUS
1000.0000 mL | Freq: Once | INTRAVENOUS | Status: AC
  Administered 2019-08-14: 17:00:00 1000 mL via INTRAVENOUS

## 2019-08-14 MED ORDER — CEPHALEXIN 500 MG PO CAPS: 500.0000 mg | ORAL_CAPSULE | Freq: Three times a day (TID) | ORAL | 0 refills | Status: DC

## 2019-08-14 MED ORDER — MECLIZINE HCL 25 MG PO TABS: 25.0000 mg | ORAL_TABLET | Freq: Three times a day (TID) | ORAL | 0 refills | Status: AC | PRN

## 2019-08-14 MED ORDER — SODIUM CHLORIDE 0.9% FLUSH
3.0000 mL | Freq: Once | INTRAVENOUS | Status: AC
  Administered 2019-08-14: 17:00:00 3 mL via INTRAVENOUS

## 2019-08-14 MED ORDER — MECLIZINE HCL 25 MG PO TABS
25.0000 mg | ORAL_TABLET | Freq: Once | ORAL | Status: AC
  Administered 2019-08-14: 25 mg via ORAL
  Filled 2019-08-14: qty 1

## 2019-08-14 MED ORDER — SODIUM CHLORIDE 0.9 % IV BOLUS
1000.0000 mL | Freq: Once | INTRAVENOUS | Status: AC
  Administered 2019-08-14: 19:00:00 1000 mL via INTRAVENOUS

## 2019-08-14 NOTE — ED Provider Notes (Signed)
[facility]-EMERGENCY DEPT Provider Note   CSN: [id number] Arrival date & time: 08/14/19  1507    History   Chief Complaint Chief Complaint  Patient presents with  . Dizziness    HPI James ReeseRobert Mcintosh is a 48 y.o. male.     HPI Patient started complaining of lightheadedness this morning.  States is associated with nausea.  Unsure of any exacerbating factors.  States has had this before due to heat exhaustion but denies heat exposure.  States he is currently in a detention center waiting for COVID testing to come back.  He denies any current pain.  Specific no chest pain or shortness of breath.  No abdominal pain.  Denies headache, earache or hearing changes.  No recent fever or chills. Past Medical History:  Diagnosis Date  . Alcohol abuse   . Asthma   . HIV (human immunodeficiency virus infection) (HCC)   . Substance abuse (HCC)     There are no active problems to display for this patient.   Past Surgical History:  Procedure Laterality Date  . MANDIBLE SURGERY          Home Medications    Prior to Admission medications   Medication Sig Start Date End Date Taking? Authorizing Provider  acetaminophen (TYLENOL) 500 MG tablet Take 1,000 mg by mouth daily as needed for headache.   Yes [provider]  dolutegravir (TIVICAY) 50 MG tablet Take 50 mg by mouth at bedtime.   Yes [provider]  lamivudine (EPIVIR) 300 MG tablet Take 300 mg by mouth at bedtime.   Yes [provider]  PREZCOBIX 800-150 MG tablet Take 1 tablet by mouth at bedtime.  05/08/18  Yes [provider]  albuterol (PROVENTIL HFA;VENTOLIN HFA) 108 (90 Base) MCG/ACT inhaler Inhale 1-2 puffs into the lungs every 6 (six) hours as needed for wheezing or shortness of breath.    [provider]  cephALEXin (KEFLEX) 500 MG capsule Take 1 capsule (500 mg total) by mouth 3 (three) times daily. 08/14/19   Loren RacerYelverton, Khristopher Kapaun, MD  meclizine (ANTIVERT) 25 MG  tablet Take 1 tablet (25 mg total) by mouth 3 (three) times daily as needed for dizziness. 08/14/19   Loren RacerYelverton, Jaeceon Michelin, MD    Family History No family history on file.  Social History Social History   Tobacco Use  . Smoking status: Current Every Day Smoker    Packs/day: 1.00    Types: Cigarettes  . Smokeless tobacco: Never Used  Substance Use Topics  . Alcohol use: Yes    Comment: clean date 12/08/14  . Drug use: Yes    Types: Marijuana    Comment: clean 12/08/14     Allergies   Iohexol   Review of Systems Review of Systems  Constitutional: Negative for chills, fatigue and fever.  HENT: Negative for ear pain, hearing loss, sore throat and trouble swallowing.   Eyes: Negative for visual disturbance.  Respiratory: Negative for cough and shortness of breath.   Cardiovascular: Negative for chest pain.  Gastrointestinal: Positive for nausea. Negative for abdominal pain, constipation, diarrhea and vomiting.  Genitourinary: Negative for dysuria, flank pain and frequency.  Musculoskeletal: Negative for back pain, gait problem, myalgias and neck pain.  Skin: Negative for rash and wound.  Neurological: Positive for dizziness and light-headedness. Negative for speech difficulty, weakness, numbness and headaches.  All other systems reviewed and are negative.    Physical Exam Updated Vital Signs BP (!) 126/91   Pulse 73   Temp  98 F (36.7 C) (Oral)   Resp 15   Ht 6\' 1"  (1.854 m)   Wt 81.6 kg   SpO2 97%   BMI 23.75 kg/m   Physical Exam Vitals signs and nursing note reviewed.  Constitutional:      General: He is not in acute distress.    Appearance: Normal appearance. He is well-developed. He is not ill-appearing.  HENT:     Head: Normocephalic and atraumatic.     Nose: Nose normal.     Mouth/Throat:     Mouth: Mucous membranes are moist.  Eyes:     Extraocular Movements: Extraocular movements intact.     Pupils: Pupils are equal, round, and reactive to light.      Comments: Fatigable horizontal nystagmus.  Neck:     Musculoskeletal: Normal range of motion and neck supple. No neck rigidity or muscular tenderness.  Cardiovascular:     Rate and Rhythm: Normal rate and regular rhythm.     Heart sounds: No murmur. No friction rub. No gallop.   Pulmonary:     Effort: Pulmonary effort is normal. No respiratory distress.     Breath sounds: Normal breath sounds. No stridor. No wheezing, rhonchi or rales.  Chest:     Chest wall: No tenderness.  Abdominal:     General: Bowel sounds are normal.     Palpations: Abdomen is soft.     Tenderness: There is no abdominal tenderness. There is no guarding or rebound.  Musculoskeletal: Normal range of motion.        General: No swelling, tenderness, deformity or signs of injury.     Right lower leg: No edema.     Left lower leg: No edema.  Lymphadenopathy:     Cervical: No cervical adenopathy.  Skin:    General: Skin is warm and dry.     Findings: No erythema or rash.  Neurological:     General: No focal deficit present.     Mental Status: He is alert and oriented to person, place, and time.     Comments: Patient is alert and oriented x3 with clear, goal oriented speech. Patient has 5/5 motor in all extremities. Sensation is intact to light touch. Bilateral finger-to-nose is normal with no signs of dysmetria. Patient has a normal gait and walks without assistance.  Psychiatric:        Mood and Affect: Mood normal.        Behavior: Behavior normal.      ED Treatments / Results  Labs (all labs ordered are listed, but only abnormal results are displayed) Labs Reviewed  CBC - Abnormal; Notable for the following components:      Result Value   WBC 3.7 (*)    All other components within normal limits  URINALYSIS, ROUTINE W REFLEX MICROSCOPIC - Abnormal; Notable for the following components:   APPearance HAZY (*)    Ketones, ur 5 (*)    Nitrite POSITIVE (*)    Leukocytes,Ua LARGE (*)    Bacteria, UA MANY  (*)    All other components within normal limits  COMPREHENSIVE METABOLIC PANEL - Abnormal; Notable for the following components:   Total Protein 8.2 (*)    AST 44 (*)    All other components within normal limits  URINE CULTURE  CBG MONITORING, ED    EKG EKG Interpretation  Date/Time:  Friday August 14 2019 15:29:35 EDT Ventricular Rate:  69 PR Interval:    QRS Duration: 94 QT Interval:  421  QTC Calculation: 451 R Axis:   17 Text Interpretation:  Sinus rhythm Low voltage, extremity leads Confirmed by Julianne Rice [phone]) on 08/14/2019 3:52:36 PM   Radiology No results found.  Procedures Procedures (including critical care time)  Medications Ordered in ED Medications  sodium chloride flush (NS) 0.9 % injection 3 mL (3 mLs Intravenous Given 08/14/19 1718)  sodium chloride 0.9 % bolus 1,000 mL (0 mLs Intravenous Stopped 08/14/19 1901)  meclizine (ANTIVERT) tablet 25 mg (25 mg Oral Given 08/14/19 1957)  sodium chloride 0.9 % bolus 1,000 mL (0 mLs Intravenous Stopped 08/14/19 2008)  cephALEXin (KEFLEX) capsule 1,000 mg (1,000 mg Oral Given 08/14/19 1958)     Initial Impression / Assessment and Plan / ED Course  I have reviewed the triage vital signs and the nursing notes.  Pertinent labs & imaging results that were available during my care of the patient were reviewed by me and considered in my medical decision making (see chart for details).        Evidence of urinary tract infection on work-up.  Urine sent for culture.  Question with the dizziness may be related to peripheral vertigo.  States he is feeling much better after meclizine.  Do not believe imaging is necessary at this point.  Return precautions given.  Final Clinical Impressions(s) / ED Diagnoses   Final diagnoses:  Dizziness  Lower urinary tract infection, acute    ED Discharge Orders         Ordered    cephALEXin (KEFLEX) 500 MG capsule  3 times daily     08/14/19 2211    meclizine (ANTIVERT) 25 MG  tablet  3 times daily PRN     08/14/19 2211           Julianne Rice, MD 08/14/19 2303

## 2019-08-14 NOTE — ED Notes (Signed)
Patient is under quarantine pending COVID test results at a motel. Pt is homeless, no transportation back to [employer]. Sh[facility] at the [facility] was contacted at [phone] and they said they would arrange transport back to motel. ETA 30-45 min.

## 2019-08-14 NOTE — ED Triage Notes (Signed)
Pt from covid testing holding motel via [facility] with c/o dizziness starting this morning at around 6 am.  Pt denies any other symptoms, not orthostatic per EMS, no other complaints.  Pt negative in ED room for other signs of stroke.  Pt is waiting for covid test results in order to enter a shelter.  Pt in NAD, A&Ox4.

## 2019-08-16 LAB — URINE CULTURE: Culture: >=100000 — AB

## 2020-05-11 IMAGING — CR DG CHEST 2V
2 series · 2 of 2 positions shown · non-contrast
Comparison: 12/30/2014

CLINICAL DATA: Cough, fever, and chills today.

EXAM:
CHEST - 2 VIEW

[w chest pa]
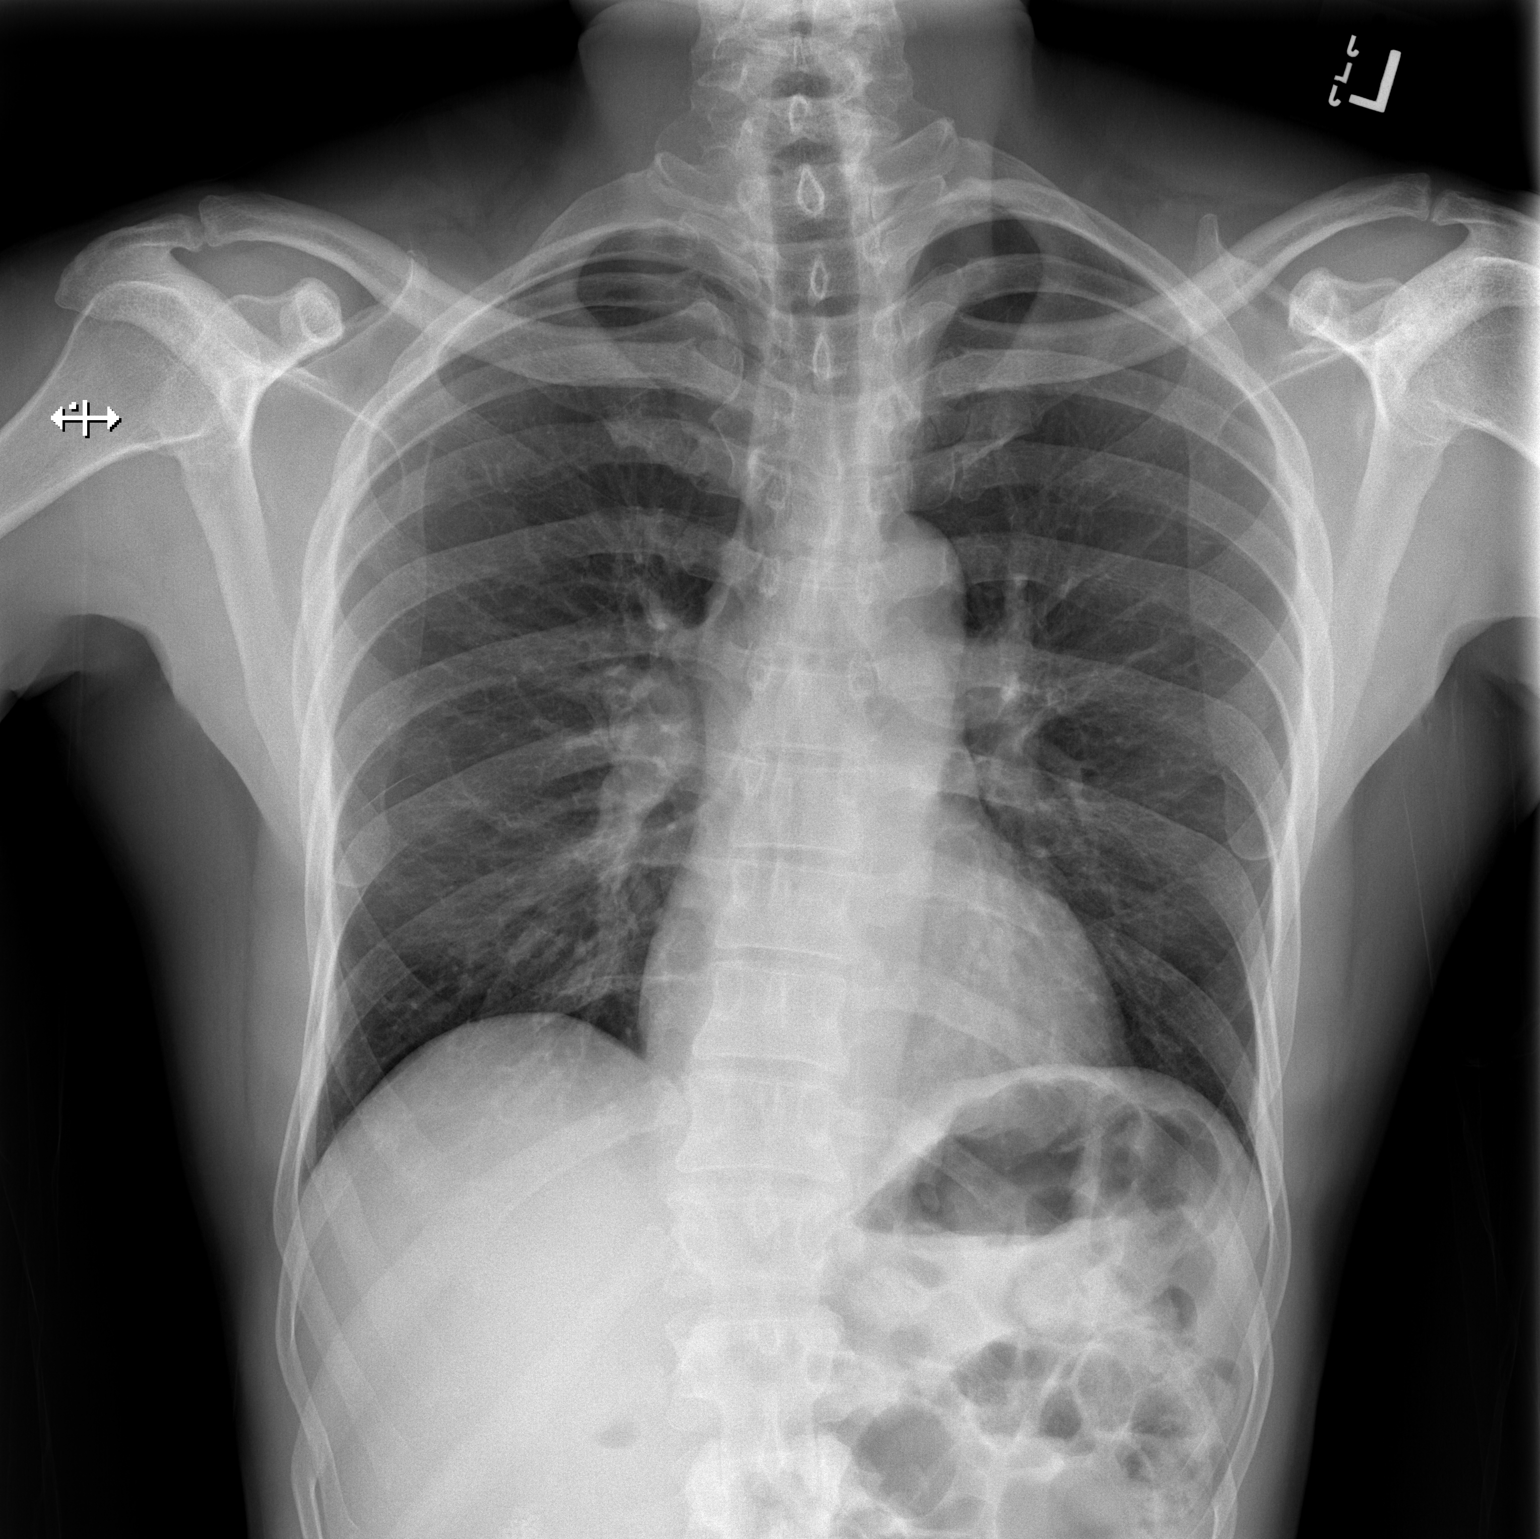

[w chest lat]
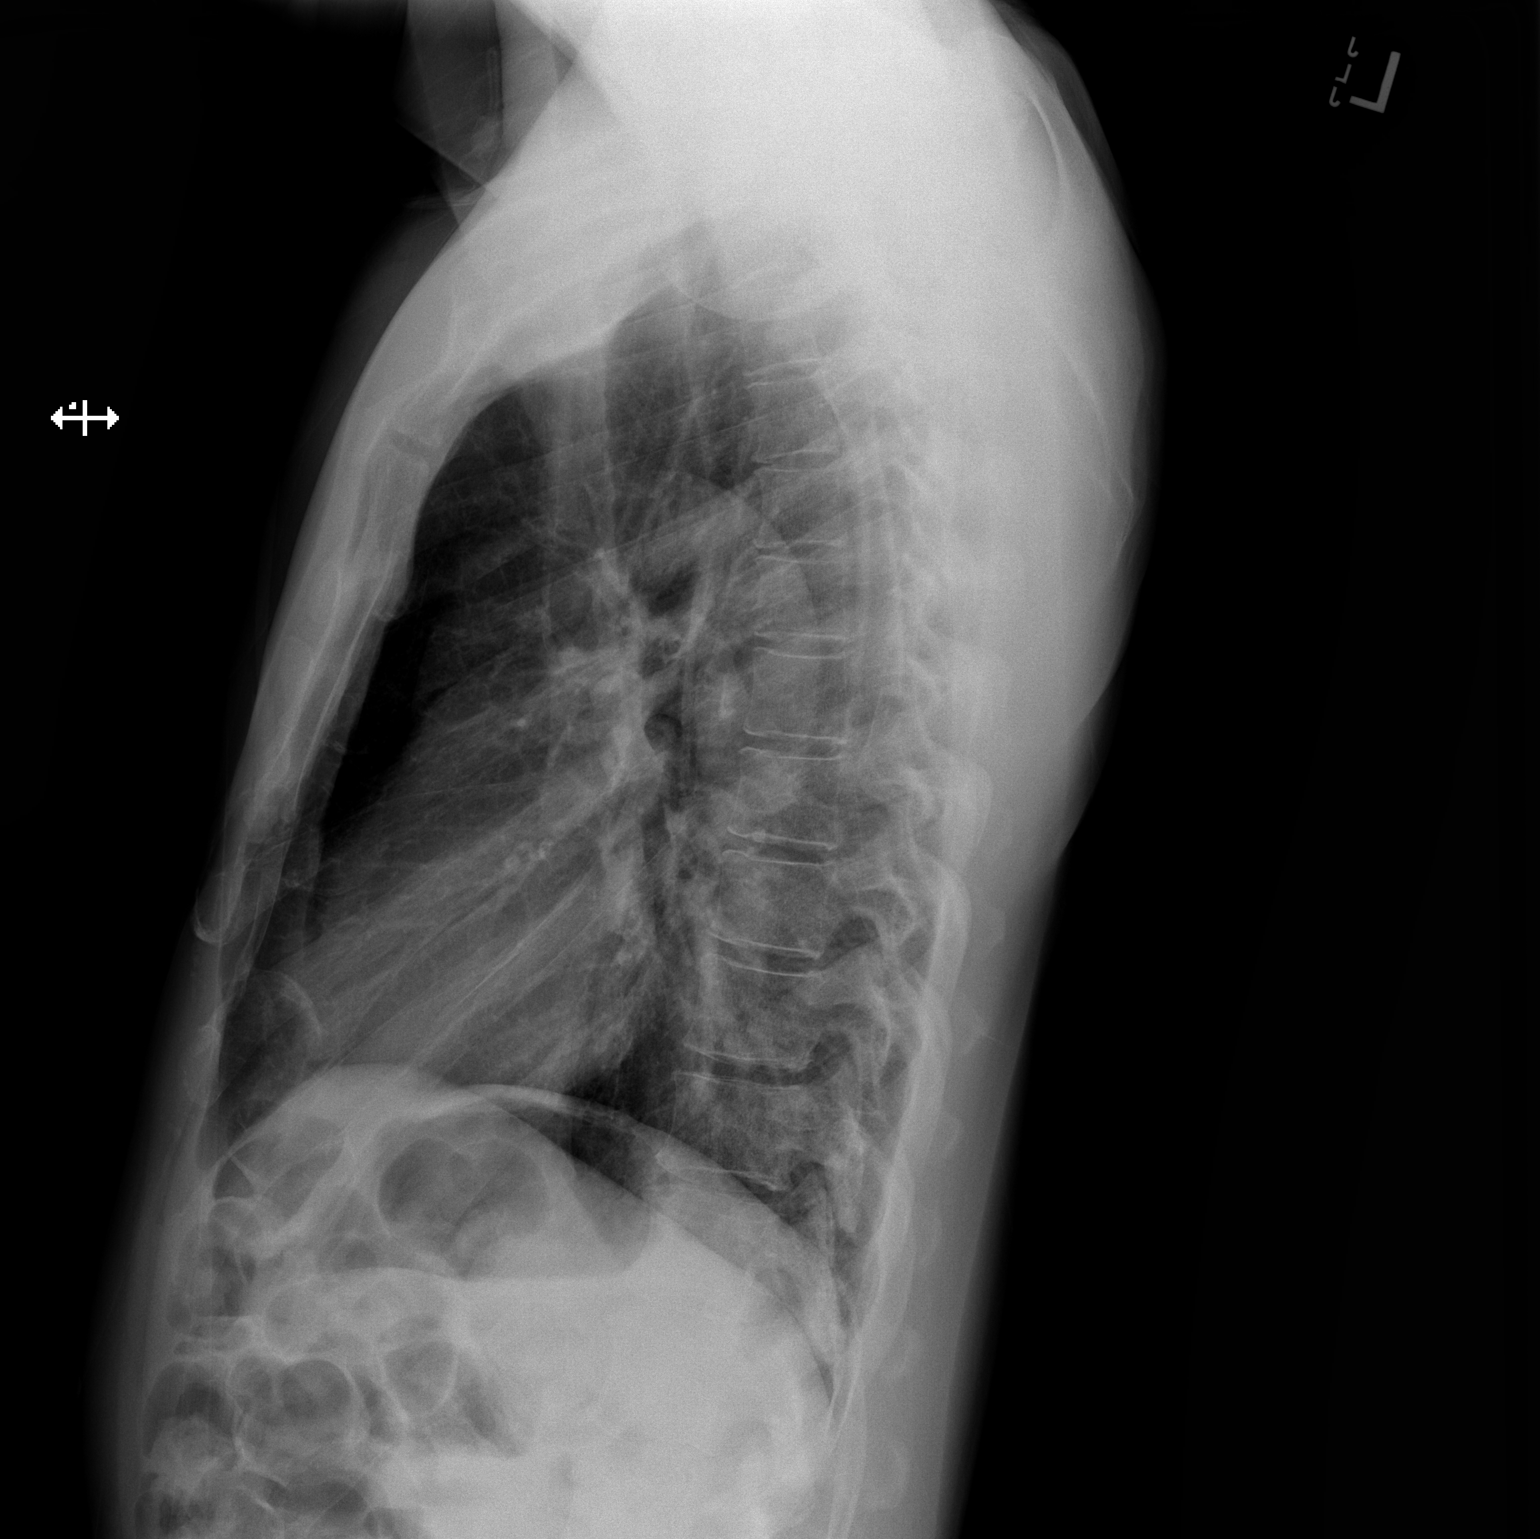

[2 of 2 positions shown; findings below may reference images not displayed]

FINDINGS: The heart size and mediastinal contours are within normal limits.
Both lungs are clear. The visualized skeletal structures are
unremarkable.
IMPRESSION: Negative.  No active cardiopulmonary disease.

## 2022-06-27 ENCOUNTER — Emergency Department (HOSPITAL_BASED_OUTPATIENT_CLINIC_OR_DEPARTMENT_OTHER): Payer: Medicare Other

## 2022-06-27 ENCOUNTER — Encounter (HOSPITAL_BASED_OUTPATIENT_CLINIC_OR_DEPARTMENT_OTHER): Payer: Self-pay | Admitting: Emergency Medicine

## 2022-06-27 ENCOUNTER — Other Ambulatory Visit: Payer: Self-pay

## 2022-06-27 ENCOUNTER — Emergency Department (HOSPITAL_BASED_OUTPATIENT_CLINIC_OR_DEPARTMENT_OTHER)
Admission: EM | Admit: 2022-06-27 | Discharge: 2022-06-27 | Disposition: A | Discharge status: Home / Self Care | Payer: Medicare Other | Attending: Emergency Medicine | Admitting: Emergency Medicine

## 2022-06-27 DIAGNOSIS — W19XXXA Unspecified fall, initial encounter: Secondary | ICD-10-CM

## 2022-06-27 DIAGNOSIS — Y908 Blood alcohol level of 240 mg/100 ml or more: Secondary | ICD-10-CM | POA: Diagnosis not present

## 2022-06-27 DIAGNOSIS — F10920 Alcohol use, unspecified with intoxication, uncomplicated: Secondary | ICD-10-CM | POA: Diagnosis not present

## 2022-06-27 DIAGNOSIS — R0781 Pleurodynia: Secondary | ICD-10-CM | POA: Diagnosis not present

## 2022-06-27 DIAGNOSIS — K852 Alcohol induced acute pancreatitis without necrosis or infection: Secondary | ICD-10-CM | POA: Insufficient documentation

## 2022-06-27 DIAGNOSIS — R4182 Altered mental status, unspecified: Secondary | ICD-10-CM | POA: Diagnosis present

## 2022-06-27 LAB — COMPREHENSIVE METABOLIC PANEL
ALT: 62 U/L — ABNORMAL HIGH (ref 0–44)
AST: 179 U/L — ABNORMAL HIGH (ref 15–41)
Albumin: 3.4 g/dL — ABNORMAL LOW (ref 3.5–5.0)
Alkaline Phosphatase: 112 U/L (ref 38–126)
Anion gap: 9 (ref 5–15)
BUN: 15 mg/dL (ref 6–20)
CO2: 23 mmol/L (ref 22–32)
Calcium: 8.1 mg/dL — ABNORMAL LOW (ref 8.9–10.3)
Chloride: 104 mmol/L (ref 98–111)
Creatinine, Ser: 1.53 mg/dL — ABNORMAL HIGH (ref 0.61–1.24)
GFR, Estimated: 54 mL/min — ABNORMAL LOW (ref >60)
Glucose, Bld: 113 mg/dL — ABNORMAL HIGH (ref 70–99)
Potassium: 3.9 mmol/L (ref 3.5–5.1)
Sodium: 136 mmol/L (ref 135–145)
Total Bilirubin: 1.4 mg/dL — ABNORMAL HIGH (ref 0.3–1.2)
Total Protein: 7.2 g/dL (ref 6.5–8.1)

## 2022-06-27 LAB — CBC
HCT: 42.4 % (ref 39.0–52.0)
Hemoglobin: 15.6 g/dL (ref 13.0–17.0)
MCH: 31.2 pg (ref 26.0–34.0)
MCHC: 36.8 g/dL — ABNORMAL HIGH (ref 30.0–36.0)
MCV: 84.8 fL (ref 80.0–100.0)
Platelets: 182 10*3/uL (ref 150–400)
RBC: 5 MIL/uL (ref 4.22–5.81)
RDW: 14 % (ref 11.5–15.5)
WBC: 11.3 10*3/uL — ABNORMAL HIGH (ref 4.0–10.5)
nRBC: 0 % (ref 0.0–0.2)

## 2022-06-27 LAB — LIPASE, BLOOD: Lipase: 103 U/L — ABNORMAL HIGH (ref 11–51)

## 2022-06-27 LAB — CBG MONITORING, ED: Glucose-Capillary: 134 mg/dL — ABNORMAL HIGH (ref 70–99)

## 2022-06-27 LAB — ETHANOL: Alcohol, Ethyl (B): 271 mg/dL — ABNORMAL HIGH (ref <10)

## 2022-06-27 MED ORDER — ESOMEPRAZOLE MAGNESIUM 40 MG PO CPDR
40.0000 mg | DELAYED_RELEASE_CAPSULE | Freq: Every day | ORAL | 0 refills | Status: DC
Start: 2022-06-27 — End: 2023-04-28

## 2022-06-27 MED ORDER — SODIUM CHLORIDE 0.9 % IV BOLUS
1000.0000 mL | Freq: Once | INTRAVENOUS | Status: AC
  Administered 2022-06-27: 1000 mL via INTRAVENOUS

## 2022-06-27 MED ORDER — CHLORDIAZEPOXIDE HCL 25 MG PO CAPS: ORAL_CAPSULE | ORAL | 0 refills | Status: DC

## 2022-06-27 NOTE — ED Provider Notes (Signed)
[facility] EMERGENCY DEPARTMENT Provider Note   CSN: [id number] Arrival date & time: 06/27/22  2006     History  Chief Complaint  Patient presents with   Fall   Altered Mental Status    Aarib Pulido is a 51 y.o. male history of alcohol abuse, HIV that is well controlled, here presenting with fall and altered mental status.  Patient lives at home with his partner.  Per patient, he woke up around 3 AM and went to the bathroom and accidentally stumbled and fell and hit his head and also left side of his ribs.  His partner noticed that he is very confused today.  He appears to be not making much sense and slow to respond.  Patient also complains of some left rib pain as well.  Patient is very vague about his alcohol use.  He states that he did drink a beer yesterday.  He adamantly denies drinking any alcohol today  The history is provided by the patient.       Home Medications Prior to Admission medications   Medication Sig Start Date End Date Taking? Authorizing Provider  acetaminophen (TYLENOL) 500 MG tablet Take 1,000 mg by mouth daily as needed for headache.    [provider]  albuterol (PROVENTIL HFA;VENTOLIN HFA) 108 (90 Base) MCG/ACT inhaler Inhale 1-2 puffs into the lungs every 6 (six) hours as needed for wheezing or shortness of breath.    [provider]  cephALEXin (KEFLEX) 500 MG capsule Take 1 capsule (500 mg total) by mouth 3 (three) times daily. 08/14/19   Loren Racer, MD  dolutegravir (TIVICAY) 50 MG tablet Take 50 mg by mouth at bedtime.    [provider]  lamivudine (EPIVIR) 300 MG tablet Take 300 mg by mouth at bedtime.    [provider]  meclizine (ANTIVERT) 25 MG tablet Take 1 tablet (25 mg total) by mouth 3 (three) times daily as needed for dizziness. 08/14/19   Loren Racer, MD  PREZCOBIX 800-150 MG tablet Take 1 tablet by mouth at bedtime.  05/08/18   [provider]      Allergies    Iodides and  Iohexol    Review of Systems   Review of Systems  Psychiatric/Behavioral:  Positive for confusion.   All other systems reviewed and are negative.   Physical Exam Updated Vital Signs BP (!) 129/98   Pulse (!) 105   Temp 98.4 F (36.9 C) (Oral)   Resp (!) 27   Ht 5\' 11"  (1.803 m)   Wt 70.3 kg   SpO2 94%   BMI 21.62 kg/m  Physical Exam Vitals and nursing note reviewed.  Constitutional:      Comments: Confused, smells of alcohol  HENT:     Head: Normocephalic.     Nose: Nose normal.     Mouth/Throat:     Mouth: Mucous membranes are moist.  Eyes:     Extraocular Movements: Extraocular movements intact.     Pupils: Pupils are equal, round, and reactive to light.  Cardiovascular:     Rate and Rhythm: Normal rate and regular rhythm.     Pulses: Normal pulses.     Heart sounds: Normal heart sounds.  Pulmonary:     Effort: Pulmonary effort is normal.     Comments: Patient has some tenderness in the left lower rib area.  No obvious ecchymosis or bruising. Abdominal:     Comments: + epigastric tenderness no obvious abdominal bruising  Musculoskeletal:  General: Normal range of motion.     Cervical back: Normal range of motion and neck supple.     Comments: No obvious extremity trauma  Skin:    General: Skin is warm.     Capillary Refill: Capillary refill takes less than 2 seconds.  Neurological:     Comments: Confused, patient is moving all extremities.  Patient appears intoxicated  Psychiatric:        Mood and Affect: Mood normal.     ED Results / Procedures / Treatments   Labs (all labs ordered are listed, but only abnormal results are displayed) Labs Reviewed  COMPREHENSIVE METABOLIC PANEL - Abnormal; Notable for the following components:      Result Value   Glucose, Bld 113 (*)    Creatinine, Ser 1.53 (*)    Calcium 8.1 (*)    Albumin 3.4 (*)    AST 179 (*)    ALT 62 (*)    Total Bilirubin 1.4 (*)    GFR, Estimated 54 (*)    All other components  within normal limits  CBC - Abnormal; Notable for the following components:   WBC 11.3 (*)    MCHC 36.8 (*)    All other components within normal limits  ETHANOL - Abnormal; Notable for the following components:   Alcohol, Ethyl (B) 271 (*)    All other components within normal limits  LIPASE, BLOOD - Abnormal; Notable for the following components:   Lipase 103 (*)    All other components within normal limits  CBG MONITORING, ED - Abnormal; Notable for the following components:   Glucose-Capillary 134 (*)    All other components within normal limits    EKG None  Radiology CT Cervical Spine Wo Contrast  Result Date: 06/27/2022 CLINICAL DATA:  Trauma EXAM: CT CERVICAL SPINE WITHOUT CONTRAST TECHNIQUE: Multidetector CT imaging of the cervical spine was performed without intravenous contrast. Multiplanar CT image reconstructions were also generated. RADIATION DOSE REDUCTION: This exam was performed according to the departmental dose-optimization program which includes automated exposure control, adjustment of the mA and/or kV according to patient size and/or use of iterative reconstruction technique. COMPARISON:  None Available. FINDINGS: Alignment: Straightening of the cervical spine.  No subluxation Skull base and vertebrae: No acute fracture. No primary bone lesion or focal pathologic process. Soft tissues and spinal canal: No prevertebral fluid or swelling. No visible canal hematoma. Disc levels: Advanced degenerative changes C4 through T1. Facet degenerative changes at multiple levels with foraminal narrowing Upper chest: Negative. Other: None IMPRESSION: Straightening of the cervical spine with degenerative change. No acute osseous abnormality Electronically Signed   By: Jasmine Pang M.D.   On: 06/27/2022 22:12   CT Head Wo Contrast  Result Date: 06/27/2022 CLINICAL DATA:  Head trauma EXAM: CT HEAD WITHOUT CONTRAST TECHNIQUE: Contiguous axial images were obtained from the base of the  skull through the vertex without intravenous contrast. RADIATION DOSE REDUCTION: This exam was performed according to the departmental dose-optimization program which includes automated exposure control, adjustment of the mA and/or kV according to patient size and/or use of iterative reconstruction technique. COMPARISON:  CT 02/16/2021 FINDINGS: Brain: No acute territorial infarction, hemorrhage or intracranial mass. Mild atrophy. The ventricles are nonenlarged Vascular: No hyperdense vessel or unexpected calcification. Skull: Normal. Negative for fracture or focal lesion. Sinuses/Orbits: No acute finding. Other: None IMPRESSION: No CT evidence for acute intracranial abnormality Electronically Signed   By: Jasmine Pang M.D.   On: 06/27/2022 22:09   CT CHEST  ABDOMEN PELVIS WO CONTRAST  Result Date: 06/27/2022 CLINICAL DATA:  Trauma hit ribs EXAM: CT CHEST, ABDOMEN AND PELVIS WITHOUT CONTRAST TECHNIQUE: Multidetector CT imaging of the chest, abdomen and pelvis was performed following the standard protocol without IV contrast. RADIATION DOSE REDUCTION: This exam was performed according to the departmental dose-optimization program which includes automated exposure control, adjustment of the mA and/or kV according to patient size and/or use of iterative reconstruction technique. COMPARISON:  CT 12/07/2019 chest CT report, 09/16/2014 scratch FINDINGS: CT CHEST FINDINGS Cardiovascular: Limited evaluation without intravenous contrast. Mild aortic atherosclerosis. No aneurysm. Normal cardiac size. No pericardial effusion Mediastinum/Nodes: Midline trachea. No thyroid mass. No suspicious lymph nodes. Lungs/Pleura: No acute airspace disease or pleural effusion. Minimal apical emphysema. No pneumothorax Musculoskeletal: Sternum is intact.  No acute osseous abnormality CT ABDOMEN PELVIS FINDINGS Hepatobiliary: Hepatic steatosis. Multiple calcified gallstones. No biliary dilatation Pancreas: Inflammatory process in the left  upper quadrant, this appears to be centered about the tail of the pancreas but is between the spleen and splenic flexure of colon. Spleen: Splenic granuloma. Trace perisplenic fluid. This does not appear to be hyperdense. Adrenals/Urinary Tract: Adrenal glands are normal. Kidneys show no hydronephrosis. Distended urinary bladder with lobulation. Stomach/Bowel: The stomach is nonenlarged. No dilated small bowel. Possible wall thickening of the splenic flexure. Negative appendix. Vascular/Lymphatic: Nonaneurysmal aorta.  No suspicious lymph nodes Reproductive: Prostate is unremarkable. Other: Negative for pelvic effusion or free air. Multiple calcified chronic mesenteric probable nodes. Musculoskeletal: No acute osseous abnormality IMPRESSION: 1. No CT evidence for acute intrathoracic abnormality. 2. Soft tissue stranding and focal inflammatory process in the left upper quadrant, this appears to be centered about the tail of pancreas but is between the splenic flexure of the colon and the spleen. Findings could be secondary to acute pancreatitis, correlation with appropriate enzymes is recommended. Other consideration could include focal colitis versus splenic trauma though only small volume of perisplenic fluid and does not appear hyperdense to suggest hematoma. There is no focal parenchymal abnormality of the spleen allowing for absence of contrast. 3. Hepatic steatosis 4. Gallstones Electronically Signed   By: Jasmine Pang M.D.   On: 06/27/2022 22:05    Procedures Procedures    Medications Ordered in ED Medications  sodium chloride 0.9 % bolus 1,000 mL (0 mLs Intravenous Stopped 06/27/22 2226)    ED Course/ Medical Decision Making/ A&P                           Medical Decision Making Donaldo Teegarden is a 51 y.o. male presenting with altered mental status after fall.  Patient apparently fell when walking to the bathroom in the middle night yesterday.  Patient was noted to be very altered.  Patient  appears to be intoxicated on my exam so consider alcohol intoxication.  Patient also had a potential head injury so also consider subdural or subarachnoid hemorrhage. Moreover he apparently hit his ribs so also consider rib fracture or splenic hematoma or intra-abdominal injuries.  Of note, patient does have IV contrast dye allergy so we will do his trauma scans without any contrast  10:41 PM I reviewed patient's labs and independently interpreted imaging studies.  Patient's labs showed alcohol level of 271.  His LFTs are elevated as well.  Moreover his lipase is 100.  His trauma scan showed no intracranial injury or any rib fractures.  He does have some inflammation around the tail of his pancreas that is consistent with pancreatitis.  I do not think he has splenic hematoma.  Patient is adamant that he did not drink any alcohol today but drink some yesterday.  Since he is here with his spouse, I think he can go home with his spouse and I told him to avoid drinking alcohol.  I think his confusion is from alcohol intoxication.   Problems Addressed: Alcoholic intoxication without complication (HCC): chronic illness or injury Alcohol-induced acute pancreatitis, unspecified complication status: acute illness or injury Fall, initial encounter: acute illness or injury  Amount and/or Complexity of Data Reviewed Labs: ordered. Decision-making details documented in ED Course. Radiology: ordered and independent interpretation performed. Decision-making details documented in ED Course.    Final Clinical Impression(s) / ED Diagnoses Final diagnoses:  None    Rx / DC Orders ED Discharge Orders     None         Charlynne Pander, MD 06/27/22 2243

## 2022-06-27 NOTE — Discharge Instructions (Addendum)
Your alcohol level is 271 today.  Your liver function is also abnormal.  I recommend that you stop drinking alcohol.  You can take Librium to help you with withdrawal symptoms if you decide to stop drinking  You need to stay hydrated.  You can take Nexium 40 mg daily to help protect your stomach.  Your pancreas is inflamed right now.  See your doctor for follow-up  Return to ER if you have worse confusion, another fall, vomiting, dehydration

## 2022-06-27 NOTE — ED Triage Notes (Signed)
Per pt family. Pt fell hit ribs has been confused, off balance and sleepy since. Do not believe LOC.

## 2022-06-27 NOTE — ED Notes (Signed)
Patient now alert, talking to his wife at bedside.  Able to answer questions appropriately this RN asks.

## 2022-07-22 ENCOUNTER — Emergency Department (HOSPITAL_BASED_OUTPATIENT_CLINIC_OR_DEPARTMENT_OTHER)
Admission: EM | Admit: 2022-07-22 | Discharge: 2022-07-23 | Disposition: A | Discharge status: Home / Self Care | Payer: Medicare Other | Attending: Emergency Medicine | Admitting: Emergency Medicine

## 2022-07-22 ENCOUNTER — Encounter (HOSPITAL_BASED_OUTPATIENT_CLINIC_OR_DEPARTMENT_OTHER): Payer: Self-pay | Admitting: Emergency Medicine

## 2022-07-22 ENCOUNTER — Other Ambulatory Visit: Payer: Self-pay

## 2022-07-22 DIAGNOSIS — R42 Dizziness and giddiness: Secondary | ICD-10-CM | POA: Diagnosis present

## 2022-07-22 DIAGNOSIS — Y905 Blood alcohol level of 100-119 mg/100 ml: Secondary | ICD-10-CM | POA: Insufficient documentation

## 2022-07-22 DIAGNOSIS — Z79899 Other long term (current) drug therapy: Secondary | ICD-10-CM | POA: Diagnosis not present

## 2022-07-22 DIAGNOSIS — K7010 Alcoholic hepatitis without ascites: Secondary | ICD-10-CM | POA: Insufficient documentation

## 2022-07-22 DIAGNOSIS — N390 Urinary tract infection, site not specified: Secondary | ICD-10-CM | POA: Insufficient documentation

## 2022-07-22 DIAGNOSIS — F10929 Alcohol use, unspecified with intoxication, unspecified: Secondary | ICD-10-CM | POA: Insufficient documentation

## 2022-07-22 LAB — CBC WITH DIFFERENTIAL/PLATELET
Abs Immature Granulocytes: 0.06 10*3/uL (ref 0.00–0.07)
Basophils Absolute: 0 10*3/uL (ref 0.0–0.1)
Basophils Relative: 1 %
Eosinophils Absolute: 0.1 10*3/uL (ref 0.0–0.5)
Eosinophils Relative: 1 %
HCT: 34.1 % — ABNORMAL LOW (ref 39.0–52.0)
Hemoglobin: 12.7 g/dL — ABNORMAL LOW (ref 13.0–17.0)
Immature Granulocytes: 1 %
Lymphocytes Relative: 47 %
Lymphs Abs: 3.4 10*3/uL (ref 0.7–4.0)
MCH: 32.2 pg (ref 26.0–34.0)
MCHC: 37.2 g/dL — ABNORMAL HIGH (ref 30.0–36.0)
MCV: 86.3 fL (ref 80.0–100.0)
Monocytes Absolute: 0.3 10*3/uL (ref 0.1–1.0)
Monocytes Relative: 4 %
Neutro Abs: 3.4 10*3/uL (ref 1.7–7.7)
Neutrophils Relative %: 46 %
Platelets: 239 10*3/uL (ref 150–400)
RBC: 3.95 MIL/uL — ABNORMAL LOW (ref 4.22–5.81)
RDW: 13.9 % (ref 11.5–15.5)
WBC: 7.3 10*3/uL (ref 4.0–10.5)
nRBC: 0 % (ref 0.0–0.2)

## 2022-07-22 LAB — URINALYSIS, ROUTINE W REFLEX MICROSCOPIC
Bilirubin Urine: NEGATIVE
Glucose, UA: NEGATIVE mg/dL
Ketones, ur: NEGATIVE mg/dL
Nitrite: NEGATIVE
Protein, ur: NEGATIVE mg/dL
Specific Gravity, Urine: 1.01 (ref 1.005–1.030)
pH: 6 (ref 5.0–8.0)

## 2022-07-22 LAB — RAPID URINE DRUG SCREEN, HOSP PERFORMED
Amphetamines: NOT DETECTED
Barbiturates: NOT DETECTED
Benzodiazepines: POSITIVE — AB
Cocaine: NOT DETECTED
Opiates: NOT DETECTED
Tetrahydrocannabinol: NOT DETECTED

## 2022-07-22 LAB — AMMONIA: Ammonia: 34 umol/L (ref 9–35)

## 2022-07-22 LAB — URINALYSIS, MICROSCOPIC (REFLEX)

## 2022-07-22 LAB — ETHANOL: Alcohol, Ethyl (B): 100 mg/dL — ABNORMAL HIGH (ref <10)

## 2022-07-22 LAB — CBG MONITORING, ED: Glucose-Capillary: 130 mg/dL — ABNORMAL HIGH (ref 70–99)

## 2022-07-22 MED ORDER — CEPHALEXIN 500 MG PO CAPS: 500.0000 mg | ORAL_CAPSULE | Freq: Four times a day (QID) | ORAL | 0 refills | Status: AC

## 2022-07-22 NOTE — ED Notes (Signed)
ED Provider at bedside. 

## 2022-07-22 NOTE — ED Triage Notes (Signed)
Pt arrives pov with friend, to triage in wheel chair, c/o slurred speech, "shakiness", unstable walking and feeling light-headed x 4 days. A/O to self. Was tx for similar symptoms x 1 week

## 2022-07-22 NOTE — ED Provider Notes (Signed)
[facility] EMERGENCY DEPARTMENT Provider Note   CSN: [id number] Arrival date & time: 07/22/22  2059     History  Chief Complaint  Patient presents with   Dizziness    James Mcintosh is a 51 y.o. male.  Presents to the ER due to multiple different complaints.  Patient provides some of the history, additional history is obtained from discussion with friend at bedside who also identifies as his caretaker.  She reports that ever since patient was seen in the hospital a few weeks ago he has continued to have some unsteadiness on walking, intermittent slurred speech, general shakiness.  She felt like symptoms were getting worse over the past couple days and she said she was finally able to convince patient to come to ER to get evaluated today.  Patient states that currently he feels fine and does not have any ongoing complaints.  He does endorse drinking a couple 40 ounce beers daily.  The friend states that she does not let patient take narcotics or benzodiazepines when he is drinking.  Completed PMP, patient has been recently prescribed large quantity of Chlorazepate (90 tabs) and Percocet (120).   Patient denies any focal weakness, numbness, speech or vision change.   HPI     Home Medications Prior to Admission medications   Medication Sig Start Date End Date Taking? Authorizing Provider  acetaminophen (TYLENOL) 500 MG tablet Take 1,000 mg by mouth daily as needed for headache.    [provider]  albuterol (PROVENTIL HFA;VENTOLIN HFA) 108 (90 Base) MCG/ACT inhaler Inhale 1-2 puffs into the lungs every 6 (six) hours as needed for wheezing or shortness of breath.    [provider]  cephALEXin (KEFLEX) 500 MG capsule Take 1 capsule (500 mg total) by mouth 4 (four) times daily for 7 days. 07/22/22 07/29/22  Milagros Loll, MD  chlordiazePOXIDE (LIBRIUM) 25 MG capsule 50mg  PO TID x 1D, then 25-50mg  PO BID X 1D, then 25-50mg  PO QD X 1D 06/27/22   06/29/22, MD  dolutegravir (TIVICAY) 50 MG tablet Take 50 mg by mouth at bedtime.    [provider]  esomeprazole (NEXIUM) 40 MG capsule Take 1 capsule (40 mg total) by mouth daily. 06/27/22   06/29/22, MD  lamivudine (EPIVIR) 300 MG tablet Take 300 mg by mouth at bedtime.    [provider]  meclizine (ANTIVERT) 25 MG tablet Take 1 tablet (25 mg total) by mouth 3 (three) times daily as needed for dizziness. 08/14/19   08/16/19, MD  PREZCOBIX 800-150 MG tablet Take 1 tablet by mouth at bedtime.  05/08/18   [provider]      Allergies    Iodides and Iohexol    Review of Systems   Review of Systems  Constitutional:  Positive for fatigue. Negative for chills and fever.  HENT:  Negative for ear pain and sore throat.   Eyes:  Negative for pain and visual disturbance.  Respiratory:  Negative for cough and shortness of breath.   Cardiovascular:  Negative for chest pain and palpitations.  Gastrointestinal:  Negative for abdominal pain and vomiting.  Genitourinary:  Negative for dysuria and hematuria.  Musculoskeletal:  Negative for arthralgias and back pain.  Skin:  Negative for color change and rash.  Neurological:  Positive for tremors and light-headedness. Negative for seizures and syncope.  All other systems reviewed and are negative.   Physical Exam Updated Vital Signs BP 118/85   Pulse 75  Temp 98.4 F (36.9 C) (Oral)   Resp 17   Ht 5\' 11"  (1.803 m)   Wt 70.3 kg   SpO2 97%   BMI 21.62 kg/m  Physical Exam Vitals and nursing note reviewed.  Constitutional:      General: He is not in acute distress.    Appearance: He is well-developed.  HENT:     Head: Normocephalic and atraumatic.  Eyes:     Conjunctiva/sclera: Conjunctivae normal.  Cardiovascular:     Rate and Rhythm: Normal rate and regular rhythm.     Heart sounds: No murmur heard. Pulmonary:     Effort: Pulmonary effort is normal. No respiratory distress.     Breath  sounds: Normal breath sounds.  Abdominal:     Palpations: Abdomen is soft.     Tenderness: There is no abdominal tenderness.  Musculoskeletal:        General: No swelling.     Cervical back: Neck supple.  Skin:    General: Skin is warm and dry.     Capillary Refill: Capillary refill takes less than 2 seconds.  Neurological:     Mental Status: He is alert.     Comments: AAOx3 CN 2-12 intact, speech clear visual fields intact 5/5 strength in b/l UE and LE Sensation to light touch intact in b/l UE and LE Normal FNF  Psychiatric:        Mood and Affect: Mood normal.     ED Results / Procedures / Treatments   Labs (all labs ordered are listed, but only abnormal results are displayed) Labs Reviewed  CBC WITH DIFFERENTIAL/PLATELET - Abnormal; Notable for the following components:      Result Value   RBC 3.95 (*)    Hemoglobin 12.7 (*)    HCT 34.1 (*)    MCHC 37.2 (*)    All other components within normal limits  COMPREHENSIVE METABOLIC PANEL - Abnormal; Notable for the following components:   Glucose, Bld 130 (*)    Creatinine, Ser 1.43 (*)    Calcium 8.3 (*)    Albumin 2.7 (*)    AST 111 (*)    ALT 50 (*)    Alkaline Phosphatase 227 (*)    GFR, Estimated 59 (*)    All other components within normal limits  ETHANOL - Abnormal; Notable for the following components:   Alcohol, Ethyl (B) 100 (*)    All other components within normal limits  RAPID URINE DRUG SCREEN, HOSP PERFORMED - Abnormal; Notable for the following components:   Benzodiazepines POSITIVE (*)    All other components within normal limits  URINALYSIS, ROUTINE W REFLEX MICROSCOPIC - Abnormal; Notable for the following components:   APPearance HAZY (*)    Hgb urine dipstick TRACE (*)    Leukocytes,Ua LARGE (*)    All other components within normal limits  URINALYSIS, MICROSCOPIC (REFLEX) - Abnormal; Notable for the following components:   Bacteria, UA MANY (*)    Non Squamous Epithelial PRESENT (*)    All  other components within normal limits  CBG MONITORING, ED - Abnormal; Notable for the following components:   Glucose-Capillary 130 (*)    All other components within normal limits  LIPASE, BLOOD  AMMONIA    EKG EKG Interpretation  Date/Time:  Sunday July 22 2022 21:24:18 EDT Ventricular Rate:  83 PR Interval:  189 QRS Duration: 96 QT Interval:  367 QTC Calculation: 432 R Axis:   68 Text Interpretation: Sinus rhythm Low voltage, extremity leads Confirmed  by Marianna Fuss ([id number]) on 07/22/2022 9:43:48 PM  Radiology No results found.  Procedures Procedures    Medications Ordered in ED Medications - No data to display  ED Course/ Medical Decision Making/ A&P                           Medical Decision Making Amount and/or Complexity of Data Reviewed Labs: ordered.  Risk Prescription drug management.   25 year old gentleman presenting to the ER with friend due to concern for generalized weakness, fatigue, unsteadiness, shakiness.  When I evaluated patient, he did not have any focal neurologic deficits on exam and had no focal neurologic complaints on history taking.  Doubt acute stroke based on both history and physical exam.  Symptoms ongoing for weeks at this point.  Check labs, observed in ER for couple hours.  Completed chart review, review of recent ER visit, review of PMP.  On initial lab work demonstrated a modestly elevated alcohol level, also noted likely urinary tract infection on urinalysis.  I feel at least some of patient's symptoms can be attributed to his chronic alcohol abuse, also suspect polypharmacy given the large amount of benzodiazepines and narcotics that he is prescribed on a regular basis.  On reassessment, patient remains alert and well-appearing, he continues to deny any ongoing neurologic complaint.  His speech is questionably slow at times but he does not have any frank dysarthria and no aphasia.  While awaiting the rest of patient's lab work  including LFTs, kidney function, electrolytes, signed out to Dr. Nicanor Alcon.  If these are stable/within normal limits, anticipate discharge and follow-up with primary care doctor in the outpatient setting.  I spent greater than 5 minutes counseling patient on importance of alcohol cessation and provided resources for local substance abuse.         Final Clinical Impression(s) / ED Diagnoses Final diagnoses:  Urinary tract infection without hematuria, site unspecified  Alcoholic intoxication with complication (HCC)  Alcoholic hepatitis, unspecified whether ascites present    Rx / DC Orders ED Discharge Orders          Ordered    cephALEXin (KEFLEX) 500 MG capsule  4 times daily        07/22/22 2345              Milagros Loll, MD 07/23/22 2151

## 2022-07-22 NOTE — Discharge Instructions (Addendum)
Take your antibiotic as prescribed.  Come back to ER if you are having worsening lethargy, numbness, weakness, speech or vision change, fever, abdominal pain, vomiting or other new concerning symptom.  Please follow-up with your primary care doctor.  I would recommend cutting back on your alcohol use.  I also would recommend minimizing your benzodiazepine use and your narcotic use as all of these medications and alcohol can contribute to your symptoms today.

## 2022-07-23 LAB — COMPREHENSIVE METABOLIC PANEL
ALT: 50 U/L — ABNORMAL HIGH (ref 0–44)
AST: 111 U/L — ABNORMAL HIGH (ref 15–41)
Albumin: 2.7 g/dL — ABNORMAL LOW (ref 3.5–5.0)
Alkaline Phosphatase: 227 U/L — ABNORMAL HIGH (ref 38–126)
Anion gap: 12 (ref 5–15)
BUN: 11 mg/dL (ref 6–20)
CO2: 25 mmol/L (ref 22–32)
Calcium: 8.3 mg/dL — ABNORMAL LOW (ref 8.9–10.3)
Chloride: 99 mmol/L (ref 98–111)
Creatinine, Ser: 1.43 mg/dL — ABNORMAL HIGH (ref 0.61–1.24)
GFR, Estimated: 59 mL/min — ABNORMAL LOW (ref >60)
Glucose, Bld: 130 mg/dL — ABNORMAL HIGH (ref 70–99)
Potassium: 4.3 mmol/L (ref 3.5–5.1)
Sodium: 136 mmol/L (ref 135–145)
Total Bilirubin: 1 mg/dL (ref 0.3–1.2)
Total Protein: 6.9 g/dL (ref 6.5–8.1)

## 2022-07-23 LAB — LIPASE, BLOOD: Lipase: 49 U/L (ref 11–51)

## 2023-04-26 ENCOUNTER — Inpatient Hospital Stay (HOSPITAL_BASED_OUTPATIENT_CLINIC_OR_DEPARTMENT_OTHER)
Admission: EM | Admit: 2023-04-26 | Discharge: 2023-04-28 | DRG: 440 | Disposition: A | Discharge status: Home / Self Care | Payer: 59 | Attending: Internal Medicine | Admitting: Internal Medicine

## 2023-04-26 ENCOUNTER — Emergency Department (HOSPITAL_BASED_OUTPATIENT_CLINIC_OR_DEPARTMENT_OTHER): Payer: 59

## 2023-04-26 ENCOUNTER — Encounter (HOSPITAL_BASED_OUTPATIENT_CLINIC_OR_DEPARTMENT_OTHER): Payer: Self-pay | Admitting: Urology

## 2023-04-26 ENCOUNTER — Other Ambulatory Visit: Payer: Self-pay

## 2023-04-26 DIAGNOSIS — Z720 Tobacco use: Secondary | ICD-10-CM | POA: Diagnosis present

## 2023-04-26 DIAGNOSIS — K852 Alcohol induced acute pancreatitis without necrosis or infection: Secondary | ICD-10-CM | POA: Diagnosis not present

## 2023-04-26 DIAGNOSIS — Z79899 Other long term (current) drug therapy: Secondary | ICD-10-CM

## 2023-04-26 DIAGNOSIS — R7401 Elevation of levels of liver transaminase levels: Secondary | ICD-10-CM | POA: Diagnosis present

## 2023-04-26 DIAGNOSIS — Z21 Asymptomatic human immunodeficiency virus [HIV] infection status: Secondary | ICD-10-CM | POA: Diagnosis present

## 2023-04-26 DIAGNOSIS — Z9049 Acquired absence of other specified parts of digestive tract: Secondary | ICD-10-CM

## 2023-04-26 DIAGNOSIS — F17210 Nicotine dependence, cigarettes, uncomplicated: Secondary | ICD-10-CM | POA: Diagnosis present

## 2023-04-26 DIAGNOSIS — N182 Chronic kidney disease, stage 2 (mild): Secondary | ICD-10-CM | POA: Diagnosis present

## 2023-04-26 DIAGNOSIS — F1010 Alcohol abuse, uncomplicated: Secondary | ICD-10-CM | POA: Diagnosis present

## 2023-04-26 DIAGNOSIS — Z91041 Radiographic dye allergy status: Secondary | ICD-10-CM

## 2023-04-26 DIAGNOSIS — N3001 Acute cystitis with hematuria: Secondary | ICD-10-CM

## 2023-04-26 DIAGNOSIS — N390 Urinary tract infection, site not specified: Secondary | ICD-10-CM | POA: Diagnosis present

## 2023-04-26 DIAGNOSIS — E876 Hypokalemia: Secondary | ICD-10-CM | POA: Diagnosis present

## 2023-04-26 DIAGNOSIS — B20 Human immunodeficiency virus [HIV] disease: Secondary | ICD-10-CM | POA: Diagnosis present

## 2023-04-26 DIAGNOSIS — I10 Essential (primary) hypertension: Secondary | ICD-10-CM

## 2023-04-26 DIAGNOSIS — J45909 Unspecified asthma, uncomplicated: Secondary | ICD-10-CM | POA: Diagnosis present

## 2023-04-26 LAB — URINALYSIS, MICROSCOPIC (REFLEX)

## 2023-04-26 LAB — LIPASE, BLOOD: Lipase: 89 U/L — ABNORMAL HIGH (ref 11–51)

## 2023-04-26 LAB — COMPREHENSIVE METABOLIC PANEL
ALT: 21 U/L (ref 0–44)
AST: 51 U/L — ABNORMAL HIGH (ref 15–41)
Albumin: 3.8 g/dL (ref 3.5–5.0)
Alkaline Phosphatase: 67 U/L (ref 38–126)
Anion gap: 12 (ref 5–15)
BUN: 13 mg/dL (ref 6–20)
CO2: 24 mmol/L (ref 22–32)
Calcium: 8.9 mg/dL (ref 8.9–10.3)
Chloride: 100 mmol/L (ref 98–111)
Creatinine, Ser: 1.41 mg/dL — ABNORMAL HIGH (ref 0.61–1.24)
GFR, Estimated: 60 mL/min — ABNORMAL LOW (ref >60)
Glucose, Bld: 117 mg/dL — ABNORMAL HIGH (ref 70–99)
Potassium: 3.8 mmol/L (ref 3.5–5.1)
Sodium: 136 mmol/L (ref 135–145)
Total Bilirubin: 0.8 mg/dL (ref 0.3–1.2)
Total Protein: 7.9 g/dL (ref 6.5–8.1)

## 2023-04-26 LAB — URINALYSIS, ROUTINE W REFLEX MICROSCOPIC
Bilirubin Urine: NEGATIVE
Glucose, UA: NEGATIVE mg/dL
Ketones, ur: NEGATIVE mg/dL
Nitrite: NEGATIVE
Protein, ur: 100 mg/dL — AB
Specific Gravity, Urine: 1.02 (ref 1.005–1.030)
pH: 6.5 (ref 5.0–8.0)

## 2023-04-26 LAB — CBC
HCT: 37.6 % — ABNORMAL LOW (ref 39.0–52.0)
Hemoglobin: 13.1 g/dL (ref 13.0–17.0)
MCH: 32 pg (ref 26.0–34.0)
MCHC: 34.8 g/dL (ref 30.0–36.0)
MCV: 91.7 fL (ref 80.0–100.0)
Platelets: 265 10*3/uL (ref 150–400)
RBC: 4.1 MIL/uL — ABNORMAL LOW (ref 4.22–5.81)
RDW: 15 % (ref 11.5–15.5)
WBC: 6.8 10*3/uL (ref 4.0–10.5)
nRBC: 0 % (ref 0.0–0.2)

## 2023-04-26 LAB — ETHANOL: Alcohol, Ethyl (B): <10 mg/dL (ref <10)

## 2023-04-26 MED ORDER — LAMIVUDINE 150 MG PO TABS: 300.0000 mg | ORAL_TABLET | Freq: Every day | ORAL | Status: DC

## 2023-04-26 MED ORDER — SODIUM CHLORIDE 0.9 % IV SOLN
2.0000 g | Freq: Once | INTRAVENOUS | Status: AC
  Administered 2023-04-26: 2 g via INTRAVENOUS
  Filled 2023-04-26: qty 20

## 2023-04-26 MED ORDER — LORAZEPAM 2 MG/ML IJ SOLN: 0.0000 mg | Freq: Two times a day (BID) | INTRAMUSCULAR | Status: DC

## 2023-04-26 MED ORDER — DARUNAVIR-COBICISTAT 800-150 MG PO TABS: 1.0000 | ORAL_TABLET | Freq: Every day | ORAL | Status: DC

## 2023-04-26 MED ORDER — THIAMINE MONONITRATE 100 MG PO TABS
100.0000 mg | ORAL_TABLET | Freq: Every day | ORAL | Status: DC
  Administered 2023-04-28: 100 mg via ORAL
  Filled 2023-04-26: qty 1

## 2023-04-26 MED ORDER — THIAMINE HCL 100 MG/ML IJ SOLN
100.0000 mg | Freq: Every day | INTRAMUSCULAR | Status: DC
  Administered 2023-04-26 – 2023-04-27 (×2): 100 mg via INTRAVENOUS
  Filled 2023-04-26 (×2): qty 2

## 2023-04-26 MED ORDER — SODIUM CHLORIDE 0.9 % IV BOLUS
1000.0000 mL | Freq: Once | INTRAVENOUS | Status: AC
  Administered 2023-04-26: 1000 mL via INTRAVENOUS

## 2023-04-26 MED ORDER — LORAZEPAM 1 MG PO TABS: 0.0000 mg | ORAL_TABLET | Freq: Two times a day (BID) | ORAL | Status: DC

## 2023-04-26 MED ORDER — DOLUTEGRAVIR SODIUM 50 MG PO TABS: 50.0000 mg | ORAL_TABLET | Freq: Every day | ORAL | Status: DC

## 2023-04-26 MED ORDER — LORAZEPAM 1 MG PO TABS: 0.0000 mg | ORAL_TABLET | Freq: Four times a day (QID) | ORAL | Status: DC

## 2023-04-26 MED ORDER — BICTEGRAVIR-EMTRICITAB-TENOFOV 50-200-25 MG PO TABS
1.0000 | ORAL_TABLET | Freq: Every evening | ORAL | Status: DC
  Administered 2023-04-27: 1 via ORAL
  Filled 2023-04-26 (×3): qty 1

## 2023-04-26 MED ORDER — ONDANSETRON HCL 4 MG/2ML IJ SOLN: 4.0000 mg | Freq: Four times a day (QID) | INTRAMUSCULAR | Status: DC | PRN

## 2023-04-26 MED ORDER — ONDANSETRON HCL 4 MG/2ML IJ SOLN
4.0000 mg | Freq: Once | INTRAMUSCULAR | Status: AC
  Administered 2023-04-26: 4 mg via INTRAVENOUS
  Filled 2023-04-26: qty 2

## 2023-04-26 MED ORDER — LORAZEPAM 2 MG/ML IJ SOLN
0.0000 mg | Freq: Four times a day (QID) | INTRAMUSCULAR | Status: DC
  Administered 2023-04-26: 1 mg via INTRAVENOUS
  Filled 2023-04-26: qty 1

## 2023-04-26 NOTE — ED Notes (Signed)
ED Provider at bedside. 

## 2023-04-26 NOTE — ED Provider Notes (Signed)
[place] [facility]MENT AT [facility] Provider Note   CSN: [id number] Arrival date & time: 04/26/23  1752     History  Chief Complaint  Patient presents with   Abdominal Pain    DERRIS MILLAN is a 52 y.o. male.  Patient with history of alcohol abuse, HIV, asthma presents today with complaints of abdominal pain, nausea, and vomiting.  He states that his symptoms began initially 3 days ago and has been persistent since then.  Pain is in the epigastric area and does not radiate.  No known sick contacts.  He is not having diarrhea, but is having regular bowel movements and passing flatus.  History of cholecystectomy, no other history of abdominal surgeries.  He is compliant with his Biktarvy.  States that he does still drink alcohol every day, approximately 2 beers and 1 glass of wine.  His last drink was yesterday morning.  He is not feel he is going through withdrawal.  He denies any history of withdrawal seizures or DTs.  Does endorse a foul odor in his urine, but denies any dysuria.  The history is provided by the patient. No language interpreter was used.  Abdominal Pain Associated symptoms: nausea and vomiting        Home Medications Prior to Admission medications   Medication Sig Start Date End Date Taking? Authorizing Provider  acetaminophen (TYLENOL) 500 MG tablet Take 1,000 mg by mouth daily as needed for headache.    [provider]  albuterol (PROVENTIL HFA;VENTOLIN HFA) 108 (90 Base) MCG/ACT inhaler Inhale 1-2 puffs into the lungs every 6 (six) hours as needed for wheezing or shortness of breath.    [provider]  chlordiazePOXIDE (LIBRIUM) 25 MG capsule 50mg  PO TID x 1D, then 25-50mg  PO BID X 1D, then 25-50mg  PO QD X 1D 06/27/22   Charlynne Pander, MD  dolutegravir (TIVICAY) 50 MG tablet Take 50 mg by mouth at bedtime.    [provider]  esomeprazole (NEXIUM) 40 MG capsule Take 1 capsule (40 mg total) by mouth daily.  06/27/22   Charlynne Pander, MD  lamivudine (EPIVIR) 300 MG tablet Take 300 mg by mouth at bedtime.    [provider]  meclizine (ANTIVERT) 25 MG tablet Take 1 tablet (25 mg total) by mouth 3 (three) times daily as needed for dizziness. 08/14/19   Loren Racer, MD  PREZCOBIX 800-150 MG tablet Take 1 tablet by mouth at bedtime.  05/08/18   [provider]      Allergies    Iodides and Iohexol    Review of Systems   Review of Systems  Gastrointestinal:  Positive for abdominal pain, nausea and vomiting.  All other systems reviewed and are negative.   Physical Exam Updated Vital Signs BP (!) 146/96   Pulse 66   Temp 98.7 F (37.1 C) (Oral)   Resp 18   Ht 5\' 11"  (1.803 m)   Wt 70.3 kg   SpO2 99%   BMI 21.62 kg/m  Physical Exam Vitals and nursing note reviewed.  Constitutional:      General: He is not in acute distress.    Appearance: Normal appearance. He is normal weight. He is not ill-appearing, toxic-appearing or diaphoretic.  HENT:     Head: Normocephalic and atraumatic.  Cardiovascular:     Rate and Rhythm: Normal rate.  Pulmonary:     Effort: Pulmonary effort is normal. No respiratory distress.  Abdominal:     General: Abdomen is  flat.     Palpations: Abdomen is soft.     Tenderness: There is generalized abdominal tenderness and tenderness in the epigastric area.  Musculoskeletal:        General: Normal range of motion.     Cervical back: Normal range of motion.  Skin:    General: Skin is warm and dry.  Neurological:     General: No focal deficit present.     Mental Status: He is alert.  Psychiatric:        Mood and Affect: Mood normal.        Behavior: Behavior normal.     ED Results / Procedures / Treatments   Labs (all labs ordered are listed, but only abnormal results are displayed) Labs Reviewed  LIPASE, BLOOD - Abnormal; Notable for the following components:      Result Value   Lipase 89 (*)    All other components within  normal limits  COMPREHENSIVE METABOLIC PANEL - Abnormal; Notable for the following components:   Glucose, Bld 117 (*)    Creatinine, Ser 1.41 (*)    AST 51 (*)    GFR, Estimated 60 (*)    All other components within normal limits  CBC - Abnormal; Notable for the following components:   RBC 4.10 (*)    HCT 37.6 (*)    All other components within normal limits  URINALYSIS, ROUTINE W REFLEX MICROSCOPIC - Abnormal; Notable for the following components:   APPearance CLOUDY (*)    Hgb urine dipstick TRACE (*)    Protein, ur 100 (*)    Leukocytes,Ua SMALL (*)    All other components within normal limits  URINALYSIS, MICROSCOPIC (REFLEX) - Abnormal; Notable for the following components:   Bacteria, UA MANY (*)    All other components within normal limits  ETHANOL  LIPID PANEL    EKG None  Radiology CT ABDOMEN PELVIS WO CONTRAST  Result Date: 04/26/2023 CLINICAL DATA:  Acute nonlocalized abdominal pain. Generalized abdominal pain and vomiting for 3 days. EXAM: CT ABDOMEN AND PELVIS WITHOUT CONTRAST TECHNIQUE: Multidetector CT imaging of the abdomen and pelvis was performed following the standard protocol without IV contrast. RADIATION DOSE REDUCTION: This exam was performed according to the departmental dose-optimization program which includes automated exposure control, adjustment of the mA and/or kV according to patient size and/or use of iterative reconstruction technique. COMPARISON:  CT 06/27/2022.  MRI 04/11/2023 FINDINGS: Lower chest: Atelectasis in the lung bases. Hepatobiliary: No focal liver abnormality is seen. Status post cholecystectomy. No biliary dilatation. Pancreas: Mild stranding and edema around the pancreas, likely to represent early changes of acute pancreatitis. No pancreatic ductal dilatation or calcification. No loculated collections. Spleen: Calcified granulomas in the spleen. Adrenals/Urinary Tract: Adrenal glands are unremarkable. Kidneys are normal, without renal  calculi, focal lesion, or hydronephrosis. Bladder wall is diffusely thickened, likely cystitis or outlet obstruction. Correlate with urinalysis. Stomach/Bowel: Stomach, small bowel, and colon are not abnormally distended. No wall thickening or inflammatory changes. Increased density in the cecum likely representing ingested medication. Appendix is normal. Densely calcified enlarged lymph nodes in the central mesentery are unchanged since prior study, probably representing sequela of chronic inflammatory process. Vascular/Lymphatic: No significant vascular findings are present. No enlarged abdominal or pelvic lymph nodes. Reproductive: Prostate gland is mildly enlarged. Other: No free air or free fluid in the abdomen. Abdominal wall musculature appears intact. Musculoskeletal: No acute or significant osseous findings. IMPRESSION: 1. Stranding and edema around the pancreas suggesting early acute pancreatitis. No loculated  collection. 2. Diffuse bladder wall thickening probably representing cystitis or outlet obstruction. Correlate with urinalysis. Prostate gland is mildly enlarged. 3. Calcified granulomas in the spleen and calcified central mesenteric lymph nodes likely represent postinflammatory changes. Electronically Signed   By: Burman Nieves M.D.   On: 04/26/2023 20:30    Procedures Procedures    Medications Ordered in ED Medications  LORazepam (ATIVAN) injection 0-4 mg (has no administration in time range)    Or  LORazepam (ATIVAN) tablet 0-4 mg (has no administration in time range)  LORazepam (ATIVAN) injection 0-4 mg (has no administration in time range)    Or  LORazepam (ATIVAN) tablet 0-4 mg (has no administration in time range)  thiamine (VITAMIN B1) tablet 100 mg (has no administration in time range)    Or  thiamine (VITAMIN B1) injection 100 mg (has no administration in time range)  cefTRIAXone (ROCEPHIN) 2 g in sodium chloride 0.9 % 100 mL IVPB (has no administration in time range)   ondansetron (ZOFRAN) injection 4 mg (has no administration in time range)  sodium chloride 0.9 % bolus 1,000 mL (1,000 mLs Intravenous New Bag/Given 04/26/23 1957)    ED Course/ Medical Decision Making/ A&P                             Medical Decision Making Amount and/or Complexity of Data Reviewed Labs: ordered. Radiology: ordered.  Risk OTC drugs. Prescription drug management.   This patient is a 52 y.o. male who presents to the ED for concern of abdominal pain, nausea, vomiting, this involves an extensive number of treatment options, and is a complaint that carries with it a high risk of complications and morbidity. The emergent differential diagnosis prior to evaluation includes, but is not limited to,  gastroenteritis, appendicitis, Bowel obstruction, Bowel perforation. Gastroparesis, DKA, Hernia, Inflammatory bowel disease, mesenteric ischemia, pancreatitis, peritonitis SBP, volvulus.  This is not an exhaustive differential.   Past Medical History / Co-morbidities / Social History: history of alcohol abuse, HIV, asthma  Physical Exam: Physical exam performed. The pertinent findings include: generalized abdominal tenderness to palpation  Lab Tests: I ordered, and personally interpreted labs.  The pertinent results include:  creatinine 1.41 consistent with previous. Lipase 89. UA with leukocytes, 11-20 WBCs, many bacteria   Imaging Studies: I ordered imaging studies including CT abdomen pelvis. I independently visualized and interpreted imaging which showed   1. Stranding and edema around the pancreas suggesting early acute pancreatitis. No loculated collection. 2. Diffuse bladder wall thickening probably representing cystitis or outlet obstruction. Correlate with urinalysis. Prostate gland is mildly enlarged. 3. Calcified granulomas in the spleen and calcified central mesenteric lymph nodes likely represent postinflammatory changes.  I agree with the radiologist  interpretation.   Medications: I ordered medication including fluids, zofran, thiamine, rocephin  for dehydration, nausea/vomiting, UTI, alcohol abuse. Reevaluation of the patient after these medicines showed that the patient improved. I have reviewed the patients home medicines and have made adjustments as needed.   Disposition:  Patient presents today with complaints of nausea and vomiting with epigastric abdominal pain x 3 days. Found to have pancreatitis on imaging.  Patient denies any history of same.  Likely alcohol induced in nature. Discussed admission vs discharge with antiemetics and pain meds, patient would prefer admission.   Discussed patient with hospitalist Dr. Arville Care who agrees to admit  I discussed this case with my attending physician Dr. Renaye Rakers who cosigned this note including patient's presenting  symptoms, physical exam, and planned diagnostics and interventions. Attending physician stated agreement with plan or made changes to plan which were implemented.     Final Clinical Impression(s) / ED Diagnoses Final diagnoses:  Alcohol-induced acute pancreatitis without infection or necrosis  Acute cystitis with hematuria    Rx / DC Orders ED Discharge Orders     None         Vear Clock 04/26/23 2342    Terald Sleeper, MD 04/27/23 1428

## 2023-04-26 NOTE — ED Triage Notes (Addendum)
Pt states generalized abd pain and vomiting x 3 days  Denies diarrhea Covid and Flu neg at [facility] today

## 2023-04-27 DIAGNOSIS — E876 Hypokalemia: Secondary | ICD-10-CM | POA: Diagnosis present

## 2023-04-27 DIAGNOSIS — N390 Urinary tract infection, site not specified: Secondary | ICD-10-CM | POA: Diagnosis present

## 2023-04-27 DIAGNOSIS — R7401 Elevation of levels of liver transaminase levels: Secondary | ICD-10-CM | POA: Diagnosis present

## 2023-04-27 DIAGNOSIS — Z91041 Radiographic dye allergy status: Secondary | ICD-10-CM | POA: Diagnosis not present

## 2023-04-27 DIAGNOSIS — Z720 Tobacco use: Secondary | ICD-10-CM | POA: Diagnosis present

## 2023-04-27 DIAGNOSIS — Z79899 Other long term (current) drug therapy: Secondary | ICD-10-CM | POA: Diagnosis not present

## 2023-04-27 DIAGNOSIS — K852 Alcohol induced acute pancreatitis without necrosis or infection: Secondary | ICD-10-CM | POA: Diagnosis not present

## 2023-04-27 DIAGNOSIS — Z9049 Acquired absence of other specified parts of digestive tract: Secondary | ICD-10-CM | POA: Diagnosis not present

## 2023-04-27 DIAGNOSIS — N182 Chronic kidney disease, stage 2 (mild): Secondary | ICD-10-CM | POA: Diagnosis present

## 2023-04-27 DIAGNOSIS — B20 Human immunodeficiency virus [HIV] disease: Secondary | ICD-10-CM | POA: Diagnosis not present

## 2023-04-27 DIAGNOSIS — F1010 Alcohol abuse, uncomplicated: Secondary | ICD-10-CM | POA: Diagnosis present

## 2023-04-27 DIAGNOSIS — N3001 Acute cystitis with hematuria: Secondary | ICD-10-CM | POA: Diagnosis not present

## 2023-04-27 DIAGNOSIS — J45909 Unspecified asthma, uncomplicated: Secondary | ICD-10-CM | POA: Diagnosis present

## 2023-04-27 DIAGNOSIS — F17210 Nicotine dependence, cigarettes, uncomplicated: Secondary | ICD-10-CM | POA: Diagnosis present

## 2023-04-27 DIAGNOSIS — Z21 Asymptomatic human immunodeficiency virus [HIV] infection status: Secondary | ICD-10-CM | POA: Diagnosis present

## 2023-04-27 LAB — PHOSPHORUS: Phosphorus: 3 mg/dL (ref 2.5–4.6)

## 2023-04-27 LAB — COMPREHENSIVE METABOLIC PANEL
ALT: 22 U/L (ref 0–44)
AST: 57 U/L — ABNORMAL HIGH (ref 15–41)
Albumin: 3.1 g/dL — ABNORMAL LOW (ref 3.5–5.0)
Alkaline Phosphatase: 60 U/L (ref 38–126)
Anion gap: 9 (ref 5–15)
BUN: 7 mg/dL (ref 6–20)
CO2: 22 mmol/L (ref 22–32)
Calcium: 8.7 mg/dL — ABNORMAL LOW (ref 8.9–10.3)
Chloride: 104 mmol/L (ref 98–111)
Creatinine, Ser: 1.33 mg/dL — ABNORMAL HIGH (ref 0.61–1.24)
GFR, Estimated: >60 mL/min (ref >60)
Glucose, Bld: 98 mg/dL (ref 70–99)
Potassium: 3.3 mmol/L — ABNORMAL LOW (ref 3.5–5.1)
Sodium: 135 mmol/L (ref 135–145)
Total Bilirubin: 0.9 mg/dL (ref 0.3–1.2)
Total Protein: 6.3 g/dL — ABNORMAL LOW (ref 6.5–8.1)

## 2023-04-27 LAB — LIPID PANEL
Cholesterol: 144 mg/dL (ref 0–200)
HDL: 46 mg/dL (ref >40)
LDL Cholesterol: 53 mg/dL (ref 0–99)
Total CHOL/HDL Ratio: 3.1 RATIO
Triglycerides: 225 mg/dL — ABNORMAL HIGH (ref <150)
VLDL: 45 mg/dL — ABNORMAL HIGH (ref 0–40)

## 2023-04-27 LAB — TRIGLYCERIDES: Triglycerides: 102 mg/dL (ref <150)

## 2023-04-27 LAB — MAGNESIUM: Magnesium: 1.7 mg/dL (ref 1.7–2.4)

## 2023-04-27 LAB — PROTIME-INR
INR: 1.1 (ref 0.8–1.2)
Prothrombin Time: 14.3 seconds (ref 11.4–15.2)

## 2023-04-27 MED ORDER — SODIUM CHLORIDE 0.9 % IV SOLN
2.0000 g | INTRAVENOUS | Status: DC
  Administered 2023-04-27: 2 g via INTRAVENOUS
  Filled 2023-04-27: qty 20

## 2023-04-27 MED ORDER — SODIUM CHLORIDE 0.9 % IV SOLN: INTRAVENOUS | Status: DC

## 2023-04-27 MED ORDER — HYDROCODONE-ACETAMINOPHEN 5-325 MG PO TABS
1.0000 | ORAL_TABLET | Freq: Four times a day (QID) | ORAL | Status: DC | PRN
  Administered 2023-04-27: 1 via ORAL
  Filled 2023-04-27: qty 1

## 2023-04-27 MED ORDER — ENOXAPARIN SODIUM 40 MG/0.4ML IJ SOSY
40.0000 mg | PREFILLED_SYRINGE | INTRAMUSCULAR | Status: DC
  Administered 2023-04-27: 40 mg via SUBCUTANEOUS
  Filled 2023-04-27 (×2): qty 0.4

## 2023-04-27 MED ORDER — POTASSIUM CHLORIDE CRYS ER 20 MEQ PO TBCR
40.0000 meq | EXTENDED_RELEASE_TABLET | ORAL | Status: AC
  Administered 2023-04-27: 40 meq via ORAL
  Filled 2023-04-27: qty 2

## 2023-04-27 MED ORDER — MORPHINE SULFATE (PF) 2 MG/ML IV SOLN: 2.0000 mg | INTRAVENOUS | Status: DC | PRN

## 2023-04-27 MED ORDER — NICOTINE 21 MG/24HR TD PT24
21.0000 mg | MEDICATED_PATCH | Freq: Every day | TRANSDERMAL | Status: DC
  Administered 2023-04-27 – 2023-04-28 (×2): 21 mg via TRANSDERMAL
  Filled 2023-04-27 (×2): qty 1

## 2023-04-27 MED ORDER — SODIUM CHLORIDE 0.9% FLUSH
3.0000 mL | Freq: Two times a day (BID) | INTRAVENOUS | Status: DC
  Administered 2023-04-27 – 2023-04-28 (×3): 3 mL via INTRAVENOUS

## 2023-04-27 MED ORDER — ALBUTEROL SULFATE (2.5 MG/3ML) 0.083% IN NEBU: 2.5000 mg | INHALATION_SOLUTION | Freq: Four times a day (QID) | RESPIRATORY_TRACT | Status: DC | PRN

## 2023-04-27 NOTE — ED Notes (Signed)
[facility] called for transport. 

## 2023-04-27 NOTE — H&P (Addendum)
History and Physical    Patient: James Mcintosh:[id number] DOB: 05-10-1971 DOA: 04/26/2023 DOS: the patient was seen and examined on 04/27/2023 PCP: Patient, No Pcp Per  Patient coming from: [facility]  Chief Complaint:  Chief Complaint  Patient presents with   Abdominal Pain   HPI: James Mcintosh is a 52 y.o. male with medical history significant of HIV on HARRT, asthma, tobacco abuse, alcohol abuse  who presented with complaints of abdominal pain with nausea and vomiting for 4-5 days.  Patient was reported to be severe and located mostly on the left side of his abdomen with radiation to his back.  He reported having significant nausea and vomiting was unable to keep any significant amount of food or liquids down.  Other symptoms included nausea.  Denies having any dysuria, blood in stool/urine.  Patient does admit that he had been drinking more wine and beer lately and quantifies by stating "it is a lot".  In the emergency department patient was noted to be afebrile with blood pressures elevated above 157/98.  Labs significant for BUN 13, creatinine 1.41, lipase 89, AST 51, and total bilirubin 0.8.  Urinalysis noted trace hemoglobin, small leukocytes, many bacteria, and 11-21 WBCs.  CT scan of the abdomen and pelvis had noted stranding and edema around the pancreas suggesting early pancreatitis with no loculated effusion, diffuse bladder wall thickening concerning for cystitis or outlet obstruction with mildly enlarged prostate, and calcified granulomas in the spleen along with mesenteric lymph nodes thought to possibly be postinfectious.  Patient had been bolused 1 L of normal saline IV fluids, given Rocephin 2 g IV, and started on CIWA protocols.    Review of Systems: As mentioned in the history of present illness. All other systems reviewed and are negative. Past Medical History:  Diagnosis Date   Alcohol abuse    Asthma    HIV (human immunodeficiency virus infection)  (HCC)    Substance abuse (HCC)    Past Surgical History:  Procedure Laterality Date   MANDIBLE SURGERY     Social History:  reports that he has been smoking cigarettes. He has been smoking an average of 1 pack per day. He has never used smokeless tobacco. He reports current alcohol use. He reports that he does not currently use drugs after having used the following drugs: Marijuana.  Allergies  Allergen Reactions   Iodides    Iohexol Hives and Rash    Pt had one hive pop up after 75ml Omni300- Dr. Effie Shy notified Pt had one hive pop up after 75ml Omni300- Dr. Effie Shy notified    History reviewed. No pertinent family history.  Prior to Admission medications   Medication Sig Start Date End Date Taking? Authorizing Provider  acetaminophen (TYLENOL) 500 MG tablet Take 1,000 mg by mouth daily as needed for headache.    [provider]  albuterol (PROVENTIL HFA;VENTOLIN HFA) 108 (90 Base) MCG/ACT inhaler Inhale 1-2 puffs into the lungs every 6 (six) hours as needed for wheezing or shortness of breath.    [provider]  chlordiazePOXIDE (LIBRIUM) 25 MG capsule 50mg  PO TID x 1D, then 25-50mg  PO BID X 1D, then 25-50mg  PO QD X 1D 06/27/22   Charlynne Pander, MD  dolutegravir (TIVICAY) 50 MG tablet Take 50 mg by mouth at bedtime.    [provider]  esomeprazole (NEXIUM) 40 MG capsule Take 1 capsule (40 mg total) by mouth daily. 06/27/22   Charlynne Pander, MD  lamivudine (EPIVIR)  300 MG tablet Take 300 mg by mouth at bedtime.    [provider]  meclizine (ANTIVERT) 25 MG tablet Take 1 tablet (25 mg total) by mouth 3 (three) times daily as needed for dizziness. 08/14/19   Loren Racer, MD  PREZCOBIX 800-150 MG tablet Take 1 tablet by mouth at bedtime.  05/08/18   [provider]    Physical Exam: Vitals:   04/27/23 0330 04/27/23 0635 04/27/23 0729 04/27/23 0733  BP: (!) 130/91 (!) 135/92  (!) 130/94  Pulse: 71 72  84  Resp: 16 16  16   Temp:  98.5 F (36.9 C)  99.1 F (37.3 C)   TempSrc:   Oral   SpO2: 97% 99%  96%  Weight:      Height:       Constitutional: Middle-age male who appears to be in no acute distress at this time Eyes: PERRL, lids and conjunctivae normal ENMT: Mucous membranes are moist.  Neck: normal, supple  Respiratory: clear to auscultation bilaterally, no wheezing, no crackles. Normal respiratory effort. No accessory muscle use.  Cardiovascular: Regular rate and rhythm, no murmurs / rubs / gallops. No extremity edema.   Abdomen: Mild left quadrant abdominal tenderness .  Bowel sounds appreciated in all 4 quadrants Musculoskeletal: no clubbing / cyanosis. No joint deformity upper and lower extremities. Good ROM, no contractures. Normal muscle tone.  Skin: no rashes, lesions, ulcers. No induration Neurologic: CN 2-12 grossly intact.   Strength 5/5 in all 4.  Psychiatric: Normal judgment and insight. Alert and oriented x 3. Normal mood.   Data Reviewed:  \Reviewed labs, imaging, and pertinent records as noted above in HPI  Assessment and Plan: Suspected alcoholic pancreatitis Acute.  Patient presented with complaints of abdominal pain with nausea and vomiting.  Labs significant for lipase elevated at 89.  CT scan of the abdomen pelvis noted stranding and edema around the pancreas suggesting early acute pancreatitis with no loculated effusion.  Patient has been bolused 1 L normal saline IV fluids. -Admit to a MedSurg bed -Aspiration precautions with elevation head of bed -Incentive spirometry -Check triglycerides -Clear liquid diet and advance as tolerated -Continue IV fluids at 150 mL/h -Hydrocodone/morphine IV as needed for pain -Antiemetics as needed for nausea and vomiting  Suspect UTI Acute. Urinalysis noted trace hemoglobin, small leukocytes, many bacteria, and 11-21 WBCs.  CT scan also made note of diffuse bladder wall thickening concerning for cystitis or bladder outlet obstruction.  Patient has  been given empiric antibiotics of Rocephin IV. -Check urine culture if able -Check bladder scan -Continue Rocephin IV day 2/3  HIV Stable.  Patient reports compliance with Biktarvy.  Followed by infectious disease at [facility] last HIV RNA quantitative was 22.7 and HIV ultralong was 1.36 on last checked on 02/25/2023. -Continue Biktarvy -Continue outpatient follow-up with [facility]  Chronic kidney disease stage stage II Stable.  Creatinine 1.41 which appears near patient's baseline. -Continue to monitor  Transaminitis Chronic.  AST 51 and ALT 21.  Ratio of AST to ALT consistent with alcohol abuse. -Continue to monitor  Alcohol abuse Patient currently drinks unknown amount of beer and wine per day. -Continue CIWA protocols with Ativan -Transitions of care consulted for alcohol abuse  Tobacco abuse -Nicotine patch offered  DVT prophylaxis: Lovenox Advance Care Planning:   Code Status: Full Code   Consults: None Family Communication: Fianc updated at bedside  Severity of Illness: The appropriate patient status for this patient is INPATIENT. Inpatient status is judged to be  reasonable and necessary in order to provide the required intensity of service to ensure the patient's safety. The patient's presenting symptoms, physical exam findings, and initial radiographic and laboratory data in the context of their chronic comorbidities is felt to place them at high risk for further clinical deterioration. Furthermore, it is not anticipated that the patient will be medically stable for discharge from the hospital within 2 midnights of admission.   * I certify that at the point of admission it is my clinical judgment that the patient will require inpatient hospital care spanning beyond 2 midnights from the point of admission due to high intensity of service, high risk for further deterioration and high frequency of surveillance required.*  Author: Clydie Braun, MD 04/27/2023 8:41 AM  For  on call review [email or web address].

## 2023-04-27 NOTE — ED Notes (Signed)
[facility] at bedside 

## 2023-04-27 NOTE — Progress Notes (Addendum)
Plan of Care Note for accepted transfer   Patient: James Mcintosh MRN: [id number]   DOA: 04/26/2023  Facility requesting transfer: [facility] Requesting Provider: Silva Bandy, PA-C  Reason for transfer: Acute alcoholic pancreatitis and acute lower UTI Facility course: ERIQ HUFFORD is a 52 y.o. male patient with history of alcohol abuse, HIV, asthma presents today with complaints of abdominal pain, nausea, and vomiting.  He states that his symptoms began initially 3 days ago and has been persistent since then.  Pain is in the epigastric area and does not radiate.  No known sick contacts.  He is not having diarrhea, but is having regular bowel movements and passing flatus.  History of cholecystectomy, no other history of abdominal surgeries.  He is compliant with his Biktarvy.  States that he does still drink alcohol every day, approximately 2 beers and 1 glass of wine.  His last drink was yesterday morning.  He is not feel he is going through withdrawal.  He denies any history of withdrawal seizures or DTs.  Does endorse a foul odor in his urine, but denies any dysuria.   When the patient came to the ER, BP was 136/106 with otherwise normal vital signs. CMP revealed a creatinine 1.4 close to baseline and lipase of 89 with AST 51.  CBC showed microcytosis.  UA was positive for UTI and alcohol was less than 10.  Abdominal pelvic CT scan showed stranding and edema around the pancreas suggesting early acute pancreatitis as well as suspected cystitis with enlarged prostate and calcified granuloma in the spleen.  The patient was placed on CIWA protocol and was given 2 g of IV Rocephin, 4 mg of IV Zofran and 1 L bolus of IV normal saline.  Plan of care: The patient is accepted for admission to Med-surg  unit, at [facility]..   The patient will be under the care and responsibility of the ER provider until arrival to [facility].  Author: Hannah Beat, MD 04/27/2023  Check [email or web address]  for on-call coverage.  Nursing staff, Please call [facility] Admits & Consults System-Wide number on Amion as soon as patient's arrival, so appropriate admitting provider can evaluate the pt.

## 2023-04-27 NOTE — ED Notes (Signed)
Attempted to call report without answer.

## 2023-04-28 DIAGNOSIS — I10 Essential (primary) hypertension: Secondary | ICD-10-CM

## 2023-04-28 DIAGNOSIS — B20 Human immunodeficiency virus [HIV] disease: Secondary | ICD-10-CM

## 2023-04-28 LAB — COMPREHENSIVE METABOLIC PANEL
ALT: 20 U/L (ref 0–44)
AST: 36 U/L (ref 15–41)
Albumin: 2.7 g/dL — ABNORMAL LOW (ref 3.5–5.0)
Alkaline Phosphatase: 53 U/L (ref 38–126)
Anion gap: 5 (ref 5–15)
BUN: 6 mg/dL (ref 6–20)
CO2: 24 mmol/L (ref 22–32)
Calcium: 8.1 mg/dL — ABNORMAL LOW (ref 8.9–10.3)
Chloride: 107 mmol/L (ref 98–111)
Creatinine, Ser: 1.32 mg/dL — ABNORMAL HIGH (ref 0.61–1.24)
GFR, Estimated: >60 mL/min (ref >60)
Glucose, Bld: 101 mg/dL — ABNORMAL HIGH (ref 70–99)
Potassium: 3.4 mmol/L — ABNORMAL LOW (ref 3.5–5.1)
Sodium: 136 mmol/L (ref 135–145)
Total Bilirubin: 0.4 mg/dL (ref 0.3–1.2)
Total Protein: 5.9 g/dL — ABNORMAL LOW (ref 6.5–8.1)

## 2023-04-28 LAB — CBC
HCT: 28.9 % — ABNORMAL LOW (ref 39.0–52.0)
Hemoglobin: 10.1 g/dL — ABNORMAL LOW (ref 13.0–17.0)
MCH: 32.4 pg (ref 26.0–34.0)
MCHC: 34.9 g/dL (ref 30.0–36.0)
MCV: 92.6 fL (ref 80.0–100.0)
Platelets: 180 10*3/uL (ref 150–400)
RBC: 3.12 MIL/uL — ABNORMAL LOW (ref 4.22–5.81)
RDW: 15 % (ref 11.5–15.5)
WBC: 4.9 10*3/uL (ref 4.0–10.5)
nRBC: 0 % (ref 0.0–0.2)

## 2023-04-28 LAB — URINE CULTURE: Culture: NO GROWTH

## 2023-04-28 MED ORDER — ONDANSETRON HCL 4 MG PO TABS: 4.0000 mg | ORAL_TABLET | Freq: Three times a day (TID) | ORAL | 0 refills | Status: AC | PRN

## 2023-04-28 MED ORDER — ACETAMINOPHEN 500 MG PO TABS: 500.0000 mg | ORAL_TABLET | Freq: Four times a day (QID) | ORAL | Status: DC | PRN

## 2023-04-28 MED ORDER — CEPHALEXIN 500 MG PO CAPS: 500.0000 mg | ORAL_CAPSULE | Freq: Two times a day (BID) | ORAL | 0 refills | Status: AC

## 2023-04-28 MED ORDER — CEPHALEXIN 500 MG PO CAPS
500.0000 mg | ORAL_CAPSULE | Freq: Two times a day (BID) | ORAL | Status: DC
  Administered 2023-04-28: 500 mg via ORAL
  Filled 2023-04-28: qty 1

## 2023-04-28 MED ORDER — ONDANSETRON HCL 4 MG PO TABS: 4.0000 mg | ORAL_TABLET | Freq: Three times a day (TID) | ORAL | Status: DC | PRN

## 2023-04-28 MED ORDER — ACETAMINOPHEN 500 MG PO TABS: 500.0000 mg | ORAL_TABLET | Freq: Four times a day (QID) | ORAL | 0 refills | Status: AC | PRN

## 2023-04-28 MED ORDER — MAGNESIUM SULFATE 4 GM/100ML IV SOLN
4.0000 g | Freq: Once | INTRAVENOUS | Status: AC
  Administered 2023-04-28: 4 g via INTRAVENOUS
  Filled 2023-04-28: qty 100

## 2023-04-28 MED ORDER — PANTOPRAZOLE SODIUM 40 MG PO TBEC: 40.0000 mg | DELAYED_RELEASE_TABLET | Freq: Every day | ORAL | 0 refills | Status: AC

## 2023-04-28 MED ORDER — POTASSIUM CHLORIDE CRYS ER 20 MEQ PO TBCR
40.0000 meq | EXTENDED_RELEASE_TABLET | ORAL | Status: AC
  Administered 2023-04-28 (×2): 40 meq via ORAL
  Filled 2023-04-28 (×2): qty 2

## 2023-04-28 MED ORDER — PANTOPRAZOLE SODIUM 40 MG PO TBEC
40.0000 mg | DELAYED_RELEASE_TABLET | Freq: Every day | ORAL | Status: DC
  Administered 2023-04-28: 40 mg via ORAL
  Filled 2023-04-28: qty 1

## 2023-04-28 NOTE — Discharge Instructions (Signed)
Recommendations at discharge:     Continue pain control with acetaminophen and nausea control with zofran.  Patient has been advised to avoid alcohol.  Low fat diet for 10 days.  Follow up as outpatient with primary care in 7 to 10 days.

## 2023-04-28 NOTE — Assessment & Plan Note (Addendum)
This am patient with improvement in his abdominal pain. No nausea or vomiting.  He is asking to go home.   Plan to advance diet to low fat, and if his symptoms continue to improve, ok to go home and have a close follow up as outpatient. Continue as needed zofran for nausea control and acetaminophen for pain control.   Advised to avoid alcohol.

## 2023-04-28 NOTE — Plan of Care (Signed)
  Problem: Education: Goal: Knowledge of General Education information will improve Description: Including pain rating scale, medication(s)/side effects and non-pharmacologic comfort measures Outcome: Progressing   Problem: Activity: Goal: Risk for activity intolerance will decrease Outcome: Progressing   Problem: Coping: Goal: Level of anxiety will decrease Outcome: Progressing   

## 2023-04-28 NOTE — Assessment & Plan Note (Signed)
Smoking cessation counseling 

## 2023-04-28 NOTE — Assessment & Plan Note (Addendum)
Patient was placed on alcohol withdrawal protocol with benzodiazepines.  No sings of withdrawal at the time of his discharge.  He was advised about alcohol cessation.   Depression, continue devenlafaxine and clonazepam.

## 2023-04-28 NOTE — Progress Notes (Signed)
Discharge summary (AVS) packet provided to  pt/significant other with instructions. Pt/significant other verbalized understanding of instructions.No complaints. Pt d/c to home as ordered. Pt remains alert/oriented in no apparent distress. Significant other is responsible for pt's ride.

## 2023-04-28 NOTE — Discharge Summary (Addendum)
Physician Discharge Summary   Patient: James Mcintosh MRN: [id number] DOB: 20-Nov-1971  Admit date:     04/26/2023  Discharge date: 04/28/23  Discharge Physician: York Ram Pheonix Clinkscale   PCP: Patient, No Pcp Per   Recommendations at discharge:    Continue pain control with acetaminophen and nausea control with zofran.  Patient has been advised to avoid alcohol.  Low fat diet for 10 days.  Follow up as outpatient with primary care in 7 to 10 days.   Discharge Diagnoses: Principal Problem:   Acute alcoholic pancreatitis Active Problems:   UTI (urinary tract infection)   HIV (human immunodeficiency virus infection) (HCC)   Chronic kidney disease, stage II (mild)   Transaminitis   Tobacco abuse   Alcohol abuse  Resolved Problems:   * No resolved hospital problems. [facility] Course: James Mcintosh was admitted to the hospital with the working diagnosis of acute pancreatitis.   52 yo male with the past medical history of HIV, asthma, and tobacco and alcohol abuse who presented with abdominal pain. Presented with abdominal pain, nausea and vomiting for 4 to 5 days. Pain radiated to the back, and not able to eat. On his initial physical examination his blood pressure was 130/91, HR 71, RR 16 and 02 saturation 97%, lungs with no wheezing or rales, heart with S1 and S2 present and rhythmic, abdomen tender to palpation with no guarding or rebound, no lower extremity edema.    Na 135, K 3,3 Cl 104 bicarbonate 22. Glucose 98 bun 7 cr 1,33 AST 57, ALT 22.   UA SG 1,020, wbc 11-20, protein 100,  Alcohol <10   CT abdomen with stranding and edema around the pancreas, suggesting early acute pancreatitis. Diffuse bladder wall thickening probably representing cystitis or outlet obstruction.   EKG 66 bpm, normal axis, normal intervals, sinus rhythm with no significant ST segment or T wave changes.   Patient was placed on supportive medical therapy with improvement of his symptoms.   Assessment  and Plan: * Acute alcoholic pancreatitis This am patient with improvement in his abdominal pain. No nausea or vomiting.  He is asking to go home.   Plan to advance diet to low fat, and if his symptoms continue to improve, ok to go home and have a close follow up as outpatient. Continue as needed zofran for nausea control and acetaminophen for pain control.   Advised to avoid alcohol.   UTI (urinary tract infection) Patient denies urinary symptoms. CT with suggestive of urine infection. He has been afebrile and no leukocytosis.   Had IV ceftriaxone.  Plan to transition to cephalexin for total of 3 days.   HIV (human immunodeficiency virus infection) (HCC) Continue antiretroviral therapy. Follow up as outpatient.   Chronic kidney disease, stage II (mild) Hypokalemia and hypomagnesemia.   Patient had IV fluids with good toleration.   At the time of his discharge his renal function with serum cr at 1,32 with K at 3,4 and serum bicarbonate at 24. Na 136 and Mg 1,7  Plan to get 80 meq kcl in 2 divided doses and 4 g mah sulfate prior to his discharge.  Follow up renal function and electrolytes as outpatient.     Transaminitis Elevation in liver enzymes likely due to alcohol.  At the time of his discharge AST is 36 and ALT is 20.   Tobacco abuse Smoking cessation counseling.   Alcohol abuse Patient was placed on alcohol withdrawal protocol with benzodiazepines.  No sings of  withdrawal at the time of his discharge.  He was advised about alcohol cessation.          Consultants: none  Procedures performed: none   Disposition: Home Diet recommendation:  Regular diet/ low fat diet for 10 days.  DISCHARGE MEDICATION: Allergies as of 04/28/2023       Reactions   Iodides Hives, Rash   Iohexol Hives, Rash   Pt had one hive pop up after 75ml Omni300- Dr. Effie Shy notified        Medication List     STOP taking these medications    chlordiazePOXIDE 25 MG  capsule Commonly known as: LIBRIUM   esomeprazole 40 MG capsule Commonly known as: NEXIUM   ibuprofen 200 MG tablet Commonly known as: ADVIL   omega-3 acid ethyl esters 1 g capsule Commonly known as: LOVAZA   omeprazole 40 MG capsule Commonly known as: PRILOSEC   oxyCODONE-acetaminophen 5-325 MG tablet Commonly known as: PERCOCET/ROXICET   traZODone 50 MG tablet Commonly known as: DESYREL       TAKE these medications    acetaminophen 500 MG tablet Commonly known as: TYLENOL Take 1 tablet (500 mg total) by mouth every 6 (six) hours as needed for moderate pain. What changed:  how much to take when to take this reasons to take this   albuterol 108 (90 Base) MCG/ACT inhaler Commonly known as: VENTOLIN HFA Inhale 2 puffs into the lungs every 6 (six) hours as needed for wheezing or shortness of breath.   amLODipine 5 MG tablet Commonly known as: NORVASC Take 5 mg by mouth daily.   Biktarvy 50-200-25 MG Tabs tablet Generic drug: bictegravir-emtricitabine-tenofovir AF Take 1 tablet by mouth daily.   cephALEXin 500 MG capsule Commonly known as: KEFLEX Take 1 capsule (500 mg total) by mouth every 12 (twelve) hours for 3 days.   clonazePAM 0.5 MG tablet Commonly known as: KLONOPIN Take 0.5 mg by mouth 2 (two) times daily.   desvenlafaxine 50 MG 24 hr tablet Commonly known as: PRISTIQ Take 50 mg by mouth at bedtime.   diclofenac 75 MG EC tablet Commonly known as: VOLTAREN Take 75 mg by mouth 2 (two) times daily.   fenofibrate micronized 67 MG capsule Commonly known as: LOFIBRA Take 67 mg by mouth daily.   gabapentin 400 MG capsule Commonly known as: NEURONTIN Take 400 mg by mouth 3 (three) times daily.   Linzess 145 MCG Caps capsule Generic drug: linaclotide Take 145 mcg by mouth daily as needed (constipation).   meclizine 25 MG tablet Commonly known as: ANTIVERT Take 1 tablet (25 mg total) by mouth 3 (three) times daily as needed for dizziness.    montelukast 10 MG tablet Commonly known as: SINGULAIR Take 10 mg by mouth at bedtime.   naloxone 4 MG/0.1ML Liqd nasal spray kit Commonly known as: NARCAN Place 0.4 mg into the nose once.   ondansetron 4 MG tablet Commonly known as: ZOFRAN Take 1 tablet (4 mg total) by mouth every 8 (eight) hours as needed.   pantoprazole 40 MG tablet Commonly known as: PROTONIX Take 1 tablet (40 mg total) by mouth daily.        Discharge Exam: Filed Weights   04/26/23 1814  Weight: 70.3 kg   BP (!) 141/93   Pulse 65   Temp 98.6 F (37 C) (Oral)   Resp 18   Ht 5\' 11"  (1.803 m)   Wt 70.3 kg   SpO2 100%   BMI 21.62 kg/m   Patient with  no chest pain, no dyspnea, no nausea or vomiting, denies abdominal pain, he tolerated well breakfast.   Neurology awake and alert ENT with no pallor or icterus Cardiovascular with S1 and S2 present and rhythmic with no gallops, rubs or murmurs. Respiratory with no wheezing or rales  Abdomen with mild distention, non tender, no guarding.  No lower extremity edema.  Condition at discharge: stable  The results of significant diagnostics from this hospitalization (including imaging, microbiology, ancillary and laboratory) are listed below for reference.   Imaging Studies: CT ABDOMEN PELVIS WO CONTRAST  Result Date: 04/26/2023 CLINICAL DATA:  Acute nonlocalized abdominal pain. Generalized abdominal pain and vomiting for 3 days. EXAM: CT ABDOMEN AND PELVIS WITHOUT CONTRAST TECHNIQUE: Multidetector CT imaging of the abdomen and pelvis was performed following the standard protocol without IV contrast. RADIATION DOSE REDUCTION: This exam was performed according to the departmental dose-optimization program which includes automated exposure control, adjustment of the mA and/or kV according to patient size and/or use of iterative reconstruction technique. COMPARISON:  CT 06/27/2022.  MRI 04/11/2023 FINDINGS: Lower chest: Atelectasis in the lung bases.  Hepatobiliary: No focal liver abnormality is seen. Status post cholecystectomy. No biliary dilatation. Pancreas: Mild stranding and edema around the pancreas, likely to represent early changes of acute pancreatitis. No pancreatic ductal dilatation or calcification. No loculated collections. Spleen: Calcified granulomas in the spleen. Adrenals/Urinary Tract: Adrenal glands are unremarkable. Kidneys are normal, without renal calculi, focal lesion, or hydronephrosis. Bladder wall is diffusely thickened, likely cystitis or outlet obstruction. Correlate with urinalysis. Stomach/Bowel: Stomach, small bowel, and colon are not abnormally distended. No wall thickening or inflammatory changes. Increased density in the cecum likely representing ingested medication. Appendix is normal. Densely calcified enlarged lymph nodes in the central mesentery are unchanged since prior study, probably representing sequela of chronic inflammatory process. Vascular/Lymphatic: No significant vascular findings are present. No enlarged abdominal or pelvic lymph nodes. Reproductive: Prostate gland is mildly enlarged. Other: No free air or free fluid in the abdomen. Abdominal wall musculature appears intact. Musculoskeletal: No acute or significant osseous findings. IMPRESSION: 1. Stranding and edema around the pancreas suggesting early acute pancreatitis. No loculated collection. 2. Diffuse bladder wall thickening probably representing cystitis or outlet obstruction. Correlate with urinalysis. Prostate gland is mildly enlarged. 3. Calcified granulomas in the spleen and calcified central mesenteric lymph nodes likely represent postinflammatory changes. Electronically Signed   By: Burman Nieves M.D.   On: 04/26/2023 20:30    Microbiology: Results for orders placed or performed during the hospital encounter of 08/14/19  Urine culture     Status: Abnormal   Collection Time: 08/14/19  7:33 PM   Specimen: Urine, Random  Result Value Ref  Range Status   Specimen Description   Final    URINE, RANDOM Performed at [facility], [address] [place], [place] [address]    Special Requests   Final    NONE Performed at [facility], [address], [place], [place] [address]    Culture (A)  Final    >=100,000 COLONIES/mL MULTIPLE SPECIES PRESENT, SUGGEST RECOLLECTION   Report Status 08/16/2019 FINAL  Final    Labs: CBC: Recent Labs  Lab 04/26/23 1820 04/28/23 0120  WBC 6.8 4.9  HGB 13.1 10.1*  HCT 37.6* 28.9*  MCV 91.7 92.6  PLT 265 180   Basic Metabolic Panel: Recent Labs  Lab 04/26/23 1820 04/27/23 0959 04/28/23 0120  NA 136 135 136  K 3.8 3.3* 3.4*  CL 100 104  107  CO2 24 22 24   GLUCOSE 117* 98 101*  BUN 13 7 6   CREATININE 1.41* 1.33* 1.32*  CALCIUM 8.9 8.7* 8.1*  MG  --  1.7  --   PHOS  --  3.0  --    Liver Function Tests: Recent Labs  Lab 04/26/23 1820 04/27/23 0959 04/28/23 0120  AST 51* 57* 36  ALT 21 22 20   ALKPHOS 67 60 53  BILITOT 0.8 0.9 0.4  PROT 7.9 6.3* 5.9*  ALBUMIN 3.8 3.1* 2.7*   CBG: No results for input(s): "GLUCAP" in the last 168 hours.  Discharge time spent: greater than 30 minutes.  Signed: Coralie Keens, MD [facility] 04/28/2023

## 2023-04-28 NOTE — Assessment & Plan Note (Signed)
Patient denies urinary symptoms. CT with suggestive of urine infection. He has been afebrile and no leukocytosis.   Had IV ceftriaxone.  Plan to transition to cephalexin for total of 3 days.

## 2023-04-28 NOTE — Assessment & Plan Note (Signed)
Elevation in liver enzymes likely due to alcohol.  At the time of his discharge AST is 36 and ALT is 20.

## 2023-04-28 NOTE — Assessment & Plan Note (Signed)
Hypokalemia and hypomagnesemia.   Patient had IV fluids with good toleration.   At the time of his discharge his renal function with serum cr at 1,32 with K at 3,4 and serum bicarbonate at 24. Na 136 and Mg 1,7  Plan to get 80 meq kcl in 2 divided doses and 4 g mah sulfate prior to his discharge.  Follow up renal function and electrolytes as outpatient.

## 2023-04-28 NOTE — Hospital Course (Addendum)
James Mcintosh was admitted to the hospital with the working diagnosis of acute pancreatitis.   52 yo male with the past medical history of HIV, asthma, and tobacco and alcohol abuse who presented with abdominal pain. Presented with abdominal pain, nausea and vomiting for 4 to 5 days. Pain radiated to the back, and not able to eat. On his initial physical examination his blood pressure was 130/91, HR 71, RR 16 and 02 saturation 97%, lungs with no wheezing or rales, heart with S1 and S2 present and rhythmic, abdomen tender to palpation with no guarding or rebound, no lower extremity edema.    Na 135, K 3,3 Cl 104 bicarbonate 22. Glucose 98 bun 7 cr 1,33 AST 57, ALT 22.   UA SG 1,020, wbc 11-20, protein 100,  Alcohol <10   CT abdomen with stranding and edema around the pancreas, suggesting early acute pancreatitis. Diffuse bladder wall thickening probably representing cystitis or outlet obstruction.   EKG 66 bpm, normal axis, normal intervals, sinus rhythm with no significant ST segment or T wave changes.   Patient was placed on supportive medical therapy with improvement of his symptoms.

## 2023-04-28 NOTE — Assessment & Plan Note (Signed)
Continue antiretroviral therapy. Follow up as outpatient.

## 2023-04-28 NOTE — Assessment & Plan Note (Addendum)
At the time of his discharge will resume amlodipine for blood pressure control.   Dyslipidemia, continue with fenofibrate.
